# Patient Record
Sex: Female | Born: 1970 | Race: White | Hispanic: Yes | Marital: Married | Smoking: Never smoker
Health system: Southern US, Community
[De-identification: ages and names within clinical notes are randomized; demographics above are authoritative.]

## PROBLEM LIST (undated history)

## (undated) DIAGNOSIS — E785 Hyperlipidemia, unspecified: Secondary | ICD-10-CM

## (undated) DIAGNOSIS — H9190 Unspecified hearing loss, unspecified ear: Secondary | ICD-10-CM

## (undated) DIAGNOSIS — I10 Essential (primary) hypertension: Secondary | ICD-10-CM

## (undated) DIAGNOSIS — E119 Type 2 diabetes mellitus without complications: Secondary | ICD-10-CM

## (undated) DIAGNOSIS — U071 COVID-19: Secondary | ICD-10-CM

## (undated) HISTORY — DX: Type 2 diabetes mellitus without complications: E11.9

## (undated) HISTORY — DX: Essential (primary) hypertension: I10

## (undated) HISTORY — DX: Unspecified hearing loss, unspecified ear: H91.90

## (undated) HISTORY — DX: Hyperlipidemia, unspecified: E78.5

---
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
## 2000-01-28 ENCOUNTER — Encounter: Admission: RE | Admit: 2000-01-28 | Discharge: 2000-01-28 | Payer: Self-pay | Admitting: Obstetrics

## 2000-02-05 ENCOUNTER — Encounter: Admission: RE | Admit: 2000-02-05 | Discharge: 2000-02-05 | Payer: Self-pay | Admitting: Internal Medicine

## 2001-08-30 ENCOUNTER — Encounter: Payer: Self-pay | Admitting: *Deleted

## 2001-08-30 ENCOUNTER — Emergency Department (HOSPITAL_COMMUNITY): Admission: EM | Admit: 2001-08-30 | Discharge: 2001-08-30 | Payer: Self-pay

## 2001-09-14 ENCOUNTER — Encounter: Admission: RE | Admit: 2001-09-14 | Discharge: 2001-09-14 | Payer: Self-pay | Admitting: Family Medicine

## 2001-09-19 ENCOUNTER — Encounter: Admission: RE | Admit: 2001-09-19 | Discharge: 2001-09-19 | Payer: Self-pay | Admitting: Family Medicine

## 2001-10-04 ENCOUNTER — Encounter: Admission: RE | Admit: 2001-10-04 | Discharge: 2001-10-04 | Payer: Self-pay | Admitting: Family Medicine

## 2001-10-11 ENCOUNTER — Encounter: Admission: RE | Admit: 2001-10-11 | Discharge: 2001-10-11 | Payer: Self-pay | Admitting: *Deleted

## 2001-10-18 ENCOUNTER — Encounter: Admission: RE | Admit: 2001-10-18 | Discharge: 2001-10-18 | Payer: Self-pay | Admitting: *Deleted

## 2001-10-19 ENCOUNTER — Encounter: Admission: RE | Admit: 2001-10-19 | Discharge: 2002-01-17 | Payer: Self-pay | Admitting: *Deleted

## 2001-10-25 ENCOUNTER — Encounter: Admission: RE | Admit: 2001-10-25 | Discharge: 2001-10-25 | Payer: Self-pay | Admitting: *Deleted

## 2001-11-01 ENCOUNTER — Encounter: Admission: RE | Admit: 2001-11-01 | Discharge: 2001-11-01 | Payer: Self-pay | Admitting: *Deleted

## 2001-11-08 ENCOUNTER — Encounter (HOSPITAL_COMMUNITY): Admission: RE | Admit: 2001-11-08 | Discharge: 2001-12-08 | Payer: Self-pay | Admitting: *Deleted

## 2001-11-08 ENCOUNTER — Encounter: Admission: RE | Admit: 2001-11-08 | Discharge: 2001-11-08 | Payer: Self-pay | Admitting: *Deleted

## 2001-11-14 ENCOUNTER — Ambulatory Visit (HOSPITAL_COMMUNITY): Admission: RE | Admit: 2001-11-14 | Discharge: 2001-11-14 | Payer: Self-pay | Admitting: *Deleted

## 2001-11-15 ENCOUNTER — Encounter: Admission: RE | Admit: 2001-11-15 | Discharge: 2001-11-15 | Payer: Self-pay | Admitting: *Deleted

## 2001-11-22 ENCOUNTER — Encounter: Admission: RE | Admit: 2001-11-22 | Discharge: 2001-11-22 | Payer: Self-pay | Admitting: *Deleted

## 2001-12-06 ENCOUNTER — Encounter: Admission: RE | Admit: 2001-12-06 | Discharge: 2001-12-06 | Payer: Self-pay | Admitting: *Deleted

## 2001-12-13 ENCOUNTER — Encounter: Admission: RE | Admit: 2001-12-13 | Discharge: 2001-12-13 | Payer: Self-pay | Admitting: *Deleted

## 2001-12-27 ENCOUNTER — Encounter: Admission: RE | Admit: 2001-12-27 | Discharge: 2001-12-27 | Payer: Self-pay | Admitting: *Deleted

## 2002-01-03 ENCOUNTER — Encounter: Admission: RE | Admit: 2002-01-03 | Discharge: 2002-01-03 | Payer: Self-pay | Admitting: *Deleted

## 2002-01-10 ENCOUNTER — Encounter: Admission: RE | Admit: 2002-01-10 | Discharge: 2002-01-10 | Payer: Self-pay | Admitting: *Deleted

## 2002-01-24 ENCOUNTER — Inpatient Hospital Stay (HOSPITAL_COMMUNITY): Admission: AD | Admit: 2002-01-24 | Discharge: 2002-01-24 | Payer: Self-pay

## 2002-01-24 ENCOUNTER — Encounter: Admission: RE | Admit: 2002-01-24 | Discharge: 2002-01-24 | Payer: Self-pay | Admitting: *Deleted

## 2002-02-05 ENCOUNTER — Ambulatory Visit (HOSPITAL_COMMUNITY): Admission: RE | Admit: 2002-02-05 | Discharge: 2002-02-05 | Payer: Self-pay | Admitting: *Deleted

## 2002-02-07 ENCOUNTER — Encounter: Admission: RE | Admit: 2002-02-07 | Discharge: 2002-02-07 | Payer: Self-pay | Admitting: *Deleted

## 2002-02-13 ENCOUNTER — Encounter (HOSPITAL_BASED_OUTPATIENT_CLINIC_OR_DEPARTMENT_OTHER): Admission: RE | Admit: 2002-02-13 | Discharge: 2002-05-14 | Payer: Self-pay | Admitting: Internal Medicine

## 2002-02-14 ENCOUNTER — Encounter: Admission: RE | Admit: 2002-02-14 | Discharge: 2002-02-14 | Payer: Self-pay | Admitting: *Deleted

## 2002-02-21 ENCOUNTER — Encounter: Admission: RE | Admit: 2002-02-21 | Discharge: 2002-02-21 | Payer: Self-pay | Admitting: *Deleted

## 2002-02-27 ENCOUNTER — Ambulatory Visit (HOSPITAL_COMMUNITY): Admission: RE | Admit: 2002-02-27 | Discharge: 2002-02-27 | Payer: Self-pay | Admitting: *Deleted

## 2002-03-04 ENCOUNTER — Inpatient Hospital Stay (HOSPITAL_COMMUNITY): Admission: AD | Admit: 2002-03-04 | Discharge: 2002-03-04 | Payer: Self-pay | Admitting: Obstetrics and Gynecology

## 2002-03-07 ENCOUNTER — Encounter (HOSPITAL_COMMUNITY): Admission: RE | Admit: 2002-03-07 | Discharge: 2002-04-06 | Payer: Self-pay | Admitting: *Deleted

## 2002-03-07 ENCOUNTER — Encounter: Admission: RE | Admit: 2002-03-07 | Discharge: 2002-03-07 | Payer: Self-pay | Admitting: *Deleted

## 2002-03-14 ENCOUNTER — Encounter: Admission: RE | Admit: 2002-03-14 | Discharge: 2002-03-14 | Payer: Self-pay | Admitting: *Deleted

## 2002-03-21 ENCOUNTER — Encounter: Admission: RE | Admit: 2002-03-21 | Discharge: 2002-03-21 | Payer: Self-pay | Admitting: *Deleted

## 2002-03-28 ENCOUNTER — Encounter: Admission: RE | Admit: 2002-03-28 | Discharge: 2002-03-28 | Payer: Self-pay | Admitting: *Deleted

## 2002-04-04 ENCOUNTER — Encounter: Admission: RE | Admit: 2002-04-04 | Discharge: 2002-04-04 | Payer: Self-pay | Admitting: *Deleted

## 2002-04-09 ENCOUNTER — Encounter (HOSPITAL_COMMUNITY): Admission: RE | Admit: 2002-04-09 | Discharge: 2002-04-16 | Payer: Self-pay | Admitting: Obstetrics and Gynecology

## 2002-04-11 ENCOUNTER — Encounter: Admission: RE | Admit: 2002-04-11 | Discharge: 2002-04-11 | Payer: Self-pay | Admitting: *Deleted

## 2002-04-12 ENCOUNTER — Encounter: Admission: RE | Admit: 2002-04-12 | Discharge: 2002-04-12 | Payer: Self-pay | Admitting: *Deleted

## 2002-04-13 ENCOUNTER — Inpatient Hospital Stay (HOSPITAL_COMMUNITY): Admission: AD | Admit: 2002-04-13 | Discharge: 2002-04-13 | Payer: Self-pay | Admitting: Family Medicine

## 2002-04-17 ENCOUNTER — Inpatient Hospital Stay (HOSPITAL_COMMUNITY): Admission: AD | Admit: 2002-04-17 | Discharge: 2002-04-20 | Payer: Self-pay | Admitting: *Deleted

## 2002-04-30 ENCOUNTER — Encounter: Admission: RE | Admit: 2002-04-30 | Discharge: 2002-04-30 | Payer: Self-pay | Admitting: Family Medicine

## 2002-07-13 ENCOUNTER — Encounter: Admission: RE | Admit: 2002-07-13 | Discharge: 2002-07-13 | Payer: Self-pay | Admitting: Family Medicine

## 2002-08-13 ENCOUNTER — Encounter: Admission: RE | Admit: 2002-08-13 | Discharge: 2002-08-13 | Payer: Self-pay | Admitting: Family Medicine

## 2002-08-13 ENCOUNTER — Encounter: Admission: RE | Admit: 2002-08-13 | Discharge: 2002-08-13 | Payer: Self-pay | Admitting: Sports Medicine

## 2002-08-20 ENCOUNTER — Encounter: Admission: RE | Admit: 2002-08-20 | Discharge: 2002-08-20 | Payer: Self-pay | Admitting: Family Medicine

## 2003-07-16 ENCOUNTER — Encounter (INDEPENDENT_AMBULATORY_CARE_PROVIDER_SITE_OTHER): Payer: Self-pay | Admitting: *Deleted

## 2003-07-16 LAB — CONVERTED CEMR LAB

## 2003-08-01 ENCOUNTER — Encounter: Admission: RE | Admit: 2003-08-01 | Discharge: 2003-08-01 | Payer: Self-pay | Admitting: Sports Medicine

## 2004-02-10 ENCOUNTER — Encounter: Admission: RE | Admit: 2004-02-10 | Discharge: 2004-02-10 | Payer: Self-pay | Admitting: Family Medicine

## 2004-02-12 ENCOUNTER — Encounter: Admission: RE | Admit: 2004-02-12 | Discharge: 2004-02-12 | Payer: Self-pay | Admitting: Family Medicine

## 2004-04-08 ENCOUNTER — Ambulatory Visit: Payer: Self-pay | Admitting: Sports Medicine

## 2006-05-16 ENCOUNTER — Ambulatory Visit: Payer: Self-pay | Admitting: Family Medicine

## 2006-06-14 DIAGNOSIS — I10 Essential (primary) hypertension: Secondary | ICD-10-CM

## 2006-06-14 HISTORY — DX: Essential (primary) hypertension: I10

## 2006-08-11 DIAGNOSIS — H60399 Other infective otitis externa, unspecified ear: Secondary | ICD-10-CM | POA: Insufficient documentation

## 2006-08-11 DIAGNOSIS — E1165 Type 2 diabetes mellitus with hyperglycemia: Secondary | ICD-10-CM | POA: Insufficient documentation

## 2006-08-12 ENCOUNTER — Encounter (INDEPENDENT_AMBULATORY_CARE_PROVIDER_SITE_OTHER): Payer: Self-pay | Admitting: *Deleted

## 2007-02-20 ENCOUNTER — Ambulatory Visit: Payer: Self-pay | Admitting: Internal Medicine

## 2007-02-21 ENCOUNTER — Ambulatory Visit: Payer: Self-pay | Admitting: *Deleted

## 2007-03-01 ENCOUNTER — Encounter (INDEPENDENT_AMBULATORY_CARE_PROVIDER_SITE_OTHER): Payer: Self-pay | Admitting: *Deleted

## 2007-03-07 ENCOUNTER — Ambulatory Visit: Payer: Self-pay | Admitting: Family Medicine

## 2007-03-21 ENCOUNTER — Ambulatory Visit: Payer: Self-pay | Admitting: Family Medicine

## 2007-04-04 ENCOUNTER — Ambulatory Visit: Payer: Self-pay | Admitting: Family Medicine

## 2007-05-02 ENCOUNTER — Ambulatory Visit: Payer: Self-pay | Admitting: Family Medicine

## 2007-05-16 ENCOUNTER — Ambulatory Visit: Payer: Self-pay | Admitting: Family Medicine

## 2007-06-15 DIAGNOSIS — E119 Type 2 diabetes mellitus without complications: Secondary | ICD-10-CM

## 2007-06-15 HISTORY — DX: Type 2 diabetes mellitus without complications: E11.9

## 2007-06-16 ENCOUNTER — Ambulatory Visit: Payer: Self-pay | Admitting: Family Medicine

## 2008-05-29 ENCOUNTER — Encounter (INDEPENDENT_AMBULATORY_CARE_PROVIDER_SITE_OTHER): Payer: Self-pay | Admitting: Internal Medicine

## 2008-05-29 ENCOUNTER — Ambulatory Visit: Payer: Self-pay | Admitting: Internal Medicine

## 2008-05-29 LAB — CONVERTED CEMR LAB
ALT: 17 units/L (ref 0–35)
AST: 13 units/L (ref 0–37)
Albumin: 4.3 g/dL (ref 3.5–5.2)
Alkaline Phosphatase: 84 units/L (ref 39–117)
BUN: 9 mg/dL (ref 6–23)
CO2: 23 meq/L (ref 19–32)
Calcium: 8.9 mg/dL (ref 8.4–10.5)
Chloride: 103 meq/L (ref 96–112)
Creatinine, Ser: 0.71 mg/dL (ref 0.40–1.20)
Glucose, Bld: 151 mg/dL — ABNORMAL HIGH (ref 70–99)
Potassium: 4.3 meq/L (ref 3.5–5.3)
Sodium: 137 meq/L (ref 135–145)
Total Bilirubin: 0.6 mg/dL (ref 0.3–1.2)
Total Protein: 7.4 g/dL (ref 6.0–8.3)

## 2008-06-25 ENCOUNTER — Ambulatory Visit: Payer: Self-pay | Admitting: Internal Medicine

## 2008-07-11 ENCOUNTER — Ambulatory Visit: Payer: Self-pay | Admitting: Internal Medicine

## 2008-09-11 ENCOUNTER — Encounter (INDEPENDENT_AMBULATORY_CARE_PROVIDER_SITE_OTHER): Payer: Self-pay | Admitting: Internal Medicine

## 2008-09-11 ENCOUNTER — Ambulatory Visit: Payer: Self-pay | Admitting: Internal Medicine

## 2008-09-11 LAB — CONVERTED CEMR LAB
ALT: 20 units/L (ref 0–35)
AST: 17 units/L (ref 0–37)
Albumin: 4 g/dL (ref 3.5–5.2)
CO2: 23 meq/L (ref 19–32)
Calcium: 8.8 mg/dL (ref 8.4–10.5)
Chloride: 105 meq/L (ref 96–112)
Cholesterol: 122 mg/dL (ref 0–200)
Creatinine, Ser: 0.63 mg/dL (ref 0.40–1.20)
Potassium: 3.9 meq/L (ref 3.5–5.3)
Sodium: 137 meq/L (ref 135–145)
Total Protein: 7 g/dL (ref 6.0–8.3)

## 2008-09-25 ENCOUNTER — Ambulatory Visit: Payer: Self-pay | Admitting: Internal Medicine

## 2008-10-25 ENCOUNTER — Ambulatory Visit: Payer: Self-pay | Admitting: Internal Medicine

## 2008-12-25 ENCOUNTER — Ambulatory Visit: Payer: Self-pay | Admitting: Internal Medicine

## 2009-01-08 ENCOUNTER — Ambulatory Visit: Payer: Self-pay | Admitting: Internal Medicine

## 2009-04-02 ENCOUNTER — Ambulatory Visit: Payer: Self-pay | Admitting: Internal Medicine

## 2009-04-10 ENCOUNTER — Ambulatory Visit: Payer: Self-pay | Admitting: Internal Medicine

## 2009-04-10 ENCOUNTER — Encounter (INDEPENDENT_AMBULATORY_CARE_PROVIDER_SITE_OTHER): Payer: Self-pay | Admitting: Internal Medicine

## 2009-04-10 LAB — CONVERTED CEMR LAB
Cholesterol: 122 mg/dL (ref 0–200)
HDL: 37 mg/dL — ABNORMAL LOW (ref >39)
LDL Cholesterol: 70 mg/dL (ref 0–99)
Total CHOL/HDL Ratio: 3.3
Triglycerides: 75 mg/dL (ref <150)
VLDL: 15 mg/dL (ref 0–40)

## 2009-07-07 ENCOUNTER — Encounter (INDEPENDENT_AMBULATORY_CARE_PROVIDER_SITE_OTHER): Payer: Self-pay | Admitting: Family Medicine

## 2009-07-07 LAB — CONVERTED CEMR LAB
BUN: 11 mg/dL (ref 6–23)
CO2: 26 meq/L (ref 19–32)
Calcium: 8.8 mg/dL (ref 8.4–10.5)
Glucose, Bld: 116 mg/dL — ABNORMAL HIGH (ref 70–99)

## 2009-11-17 ENCOUNTER — Ambulatory Visit: Payer: Self-pay | Admitting: Internal Medicine

## 2009-12-16 ENCOUNTER — Ambulatory Visit: Payer: Self-pay | Admitting: Internal Medicine

## 2010-08-27 ENCOUNTER — Encounter (INDEPENDENT_AMBULATORY_CARE_PROVIDER_SITE_OTHER): Payer: Self-pay | Admitting: *Deleted

## 2010-08-27 LAB — CONVERTED CEMR LAB
ALT: 16 units/L (ref 0–35)
Alkaline Phosphatase: 80 units/L (ref 39–117)
CO2: 23 meq/L (ref 19–32)
Creatinine, Ser: 0.66 mg/dL (ref 0.40–1.20)
Sodium: 136 meq/L (ref 135–145)
Total Bilirubin: 0.5 mg/dL (ref 0.3–1.2)
Total Protein: 7.5 g/dL (ref 6.0–8.3)

## 2010-10-30 NOTE — Consult Note (Signed)
NAME:  Isabella Joyce, Isabella Joyce                 ACCOUNT NO.:  [id number]   MEDICAL RECORD NO.:  [id number]                   PATIENT TYPE:  REC   LOCATION:  FOOT                                 FACILITY:  [facility]   PHYSICIAN:  Jonelle Sports. Sevier, MD                DATE OF BIRTH:  [birth year]   DATE OF CONSULTATION:  02/15/2002  DATE OF DISCHARGE:                                   CONSULTATION   HISTORY:  This 39 year old [place] female who is in her mid trimester  pregnancy and with gestational diabetes was referred for pruritic rash on  the toes and plantar aspect of the left foot, particularly the forefoot.  The patient speaks very little [place] and our information is obtained from  the interpreter but apparently she has never had serious problems with her  feet, although her inspection does reveal that she has extensive  onychomycosis, particularly of that left foot.  She apparently had some  cream that she had obtained some time past in [place] which she has used with  some improvement.   PAST MEDICAL HISTORY:  Not obtainable in detail but apparently her general  health has been excellent.  She has no serious chronic conditions and aside  from her pregnancy with gestational diabetes, does well.   CURRENT MEDICATIONS:  She is on insulin presently for management of that  condition, but no other medications.   ALLERGIES:  She has no known medicinal allergies.   PHYSICAL EXAMINATION:  EXTREMITIES:  Examination today is limited to the  distal lower extremities.  The feet are quite broad with large digits, but  with no gross deformity.  Skin temperatures are normal and equal  bilaterally.  All pulses are palpable and adequate.  Monofilament testing  shows that she has protective sensation throughout both feet.  She has no  open lesions on her feet, but does have some dry cracking and slightly  inflamed skin involving the dorsal aspects of the toes, interdigital spaces,  and the plantar  forefoot and instep area on the left.  The right foot  appears to have no such involvement.  There are minor calluses of both  heels.   IMPRESSION:  Dermatocytosis left forefoot (unfortunately we are unable to  confirm this by scraping because of our lack of microscope here at this  time).   DISPOSITION:  1. The patient is given a prescription for Lotrisone cream in the generic     and advised to use that at a minimum of once daily, but twice daily if     needed because of the itching.  This prescription is provided to the     patient and the     nurse has contacted an interpreter who has explained these things to the     patient over the phone before she leaves our clinic.  2. Follow-up here will be on a p.r.n. basis.  Jonelle Sports. Sevier, MD    RES/MEDQ  D:  02/15/2002  T:  02/15/2002  Job:  [id number]   cc:   Conni Elliot, M.D.  [address]  [place]  [place] [address]  Fax: [phone]

## 2013-01-18 ENCOUNTER — Other Ambulatory Visit (HOSPITAL_COMMUNITY): Payer: Self-pay | Admitting: Internal Medicine

## 2013-01-18 DIAGNOSIS — Z1231 Encounter for screening mammogram for malignant neoplasm of breast: Secondary | ICD-10-CM

## 2013-01-19 ENCOUNTER — Ambulatory Visit (HOSPITAL_COMMUNITY)
Admission: RE | Admit: 2013-01-19 | Discharge: 2013-01-19 | Disposition: A | Discharge status: Home / Self Care | Payer: Self-pay | Source: Ambulatory Visit | Attending: Internal Medicine | Admitting: Internal Medicine

## 2013-01-19 DIAGNOSIS — Z1231 Encounter for screening mammogram for malignant neoplasm of breast: Secondary | ICD-10-CM | POA: Insufficient documentation

## 2013-02-26 ENCOUNTER — Encounter: Payer: Self-pay | Admitting: *Deleted

## 2013-03-01 ENCOUNTER — Ambulatory Visit (INDEPENDENT_AMBULATORY_CARE_PROVIDER_SITE_OTHER): Payer: Self-pay | Admitting: Family Medicine

## 2013-03-01 ENCOUNTER — Encounter: Payer: Self-pay | Admitting: Family Medicine

## 2013-03-01 VITALS — BP 138/90 | HR 72 | Temp 97.4°F | Ht 62.0 in | Wt 191.3 lb

## 2013-03-01 DIAGNOSIS — Z01419 Encounter for gynecological examination (general) (routine) without abnormal findings: Secondary | ICD-10-CM

## 2013-03-01 DIAGNOSIS — Z124 Encounter for screening for malignant neoplasm of cervix: Secondary | ICD-10-CM

## 2013-03-01 NOTE — Patient Instructions (Addendum)
  Place annual gynecologic exam patient instructions here. Prueba de Papanicolaou  (Pap Test)  La prueba de Papanicolaou estudia las clulas en la superficie del cuello del tero. Su mdico buscar cambios celulares anormales, una infeccin o clulas cancerosas. Se llama displasia cuando las clulas no son normales. La displasia puede convertirse en cncer. Son importantes las pruebas regulares de Papanicolaou para detener el desarrollo del cncer. ANTES DEL PROCEDIMIENTO   Pregntele a su mdico cundo programar su prueba de Papanicolaou. Puede ser importante que programe la prueba lejos del momento del perodo.  No use duchas vaginales ni tenga sexo Marine scientist) durante las 24 horas anteriores al procedimiento.  No use cremas vaginales ni tampones durante las 24 horas antes de la prueba.  Debe hacer pis orinar justo antes de la prueba. PROCEDIMIENTO   Deber Arsenio Loader en una camilla con los pies en los estribos.  Insertarn en la vagina un instrumento tibio metlico o de plstico (espculo) para Restaurant manager, fast food.  El mdico usar un pequeo cepillo de plstico o esptula de [place] para tomar clulas del cuello uterino.  Las clulas se colocan en un recipiente de laboratorio.  Luego se examinan en el microscopio para determinar si son normales o no. DESPUS DEL PROCEDIMIENTO  Retire los [employer] prueba. Si son anormales, puede ser que Financial risk analyst ms [employer].  Document Released: 09/15/2010 Document Revised: 08/23/2011 [facility] Patient Information 2014 [place], [place].

## 2013-03-01 NOTE — Progress Notes (Signed)
  Subjective:     Isabella Joyce is a 42 y.o. female and is here for a comprehensive physical exam. The patient reports no problems.  History   Social History  . Marital Status: Married    Spouse Name: N/A    Number of Children: N/A  . Years of Education: N/A   Occupational History  . Not on file.   Social History Main Topics  . Smoking status: Never Smoker   . Smokeless tobacco: Never Used  . Alcohol Use: No  . Drug Use: No  . Sexual Activity: Yes    Birth Control/ Protection: IUD   Other Topics Concern  . Not on file   Social History Narrative  . No narrative on file   Health Maintenance  Topic Date Due  . Tetanus/tdap  03/04/1990  . Pap Smear  07/15/2006  . Influenza Vaccine  01/12/2013    The following portions of the patient's history were reviewed and updated as appropriate: allergies, current medications, past family history, past medical history, past social history, past surgical history and problem list.  Review of Systems Pertinent items are noted in HPI.   Objective:    General appearance: alert, cooperative, appears stated age and no distress Head: Normocephalic, without obvious abnormality, atraumatic Neck: no adenopathy, no carotid bruit, no JVD, supple, symmetrical, trachea midline and thyroid not enlarged, symmetric, no tenderness/mass/nodules Lungs: clear to auscultation bilaterally and normal percussion bilaterally Heart: regular rate and rhythm, S1, S2 normal, no murmur, click, rub or gallop, normal apical impulse and prominent apical impulse Abdomen: soft, non-tender; bowel sounds normal; no masses,  no organomegaly Extremities: extremities normal, atraumatic, no cyanosis or edema    Normal labia, moisture, NO strings visualized from IUD. Pap smear perfomred. Assessment:    Healthy female exam. Here for routine pap smear      Plan:  Pap smear today. Pap With HPV - if negative will screen q5years F/u with PCM for additional routine  care and screening. Non visualized strings, pt currently amenorric for 1year. Likely in place and not a concern for pt at this time. May require additional eval at time of removal.  Tawana Scale, MD [facility] Fellow

## 2013-03-06 ENCOUNTER — Telehealth: Payer: Self-pay | Admitting: *Deleted

## 2013-03-06 ENCOUNTER — Encounter: Payer: Self-pay | Admitting: Family Medicine

## 2013-03-06 DIAGNOSIS — Z124 Encounter for screening for malignant neoplasm of cervix: Secondary | ICD-10-CM | POA: Insufficient documentation

## 2013-03-06 NOTE — Telephone Encounter (Signed)
Message copied by Mannie Stabile on Tue Mar 06, 2013 11:48 AM ------      Message from: Minta Balsam      Created: Tue Mar 06, 2013  9:54 AM       Please call and inform normal pap smear. No HPV. Due for pap in 2019            THanks MRO ------

## 2013-03-06 NOTE — Telephone Encounter (Signed)
Called patient informed her of normal pap and followup recommendations.

## 2014-03-04 ENCOUNTER — Encounter: Payer: Self-pay | Admitting: Family Medicine

## 2014-03-04 ENCOUNTER — Ambulatory Visit: Payer: Self-pay | Attending: Family Medicine | Admitting: Family Medicine

## 2014-03-04 VITALS — BP 123/83 | HR 70 | Temp 97.9°F | Resp 18 | Ht 65.0 in | Wt 181.0 lb

## 2014-03-04 DIAGNOSIS — R7309 Other abnormal glucose: Secondary | ICD-10-CM

## 2014-03-04 DIAGNOSIS — Z23 Encounter for immunization: Secondary | ICD-10-CM

## 2014-03-04 DIAGNOSIS — B351 Tinea unguium: Secondary | ICD-10-CM

## 2014-03-04 DIAGNOSIS — I10 Essential (primary) hypertension: Secondary | ICD-10-CM

## 2014-03-04 DIAGNOSIS — E119 Type 2 diabetes mellitus without complications: Secondary | ICD-10-CM

## 2014-03-04 DIAGNOSIS — J309 Allergic rhinitis, unspecified: Secondary | ICD-10-CM

## 2014-03-04 DIAGNOSIS — R739 Hyperglycemia, unspecified: Secondary | ICD-10-CM

## 2014-03-04 LAB — COMPLETE METABOLIC PANEL WITH GFR
ALBUMIN: 4.2 g/dL (ref 3.5–5.2)
ALK PHOS: 75 U/L (ref 39–117)
ALT: 11 U/L (ref 0–35)
AST: 11 U/L (ref 0–37)
BUN: 11 mg/dL (ref 6–23)
CHLORIDE: 104 meq/L (ref 96–112)
CO2: 26 mEq/L (ref 19–32)
Calcium: 9.4 mg/dL (ref 8.4–10.5)
Creat: 0.68 mg/dL (ref 0.50–1.10)
GFR, Est African American: >89 mL/min
GFR, Est Non African American: >89 mL/min
Glucose, Bld: 110 mg/dL — ABNORMAL HIGH (ref 70–99)
POTASSIUM: 4.7 meq/L (ref 3.5–5.3)
SODIUM: 138 meq/L (ref 135–145)
TOTAL PROTEIN: 7.2 g/dL (ref 6.0–8.3)
Total Bilirubin: 0.8 mg/dL (ref 0.2–1.2)

## 2014-03-04 LAB — POCT GLYCOSYLATED HEMOGLOBIN (HGB A1C): Hemoglobin A1C: 6.5

## 2014-03-04 LAB — LIPID PANEL
CHOL/HDL RATIO: 3.3 ratio
Cholesterol: 142 mg/dL (ref 0–200)
HDL: 43 mg/dL (ref >39)
LDL CALC: 82 mg/dL (ref 0–99)
Triglycerides: 85 mg/dL (ref <150)
VLDL: 17 mg/dL (ref 0–40)

## 2014-03-04 LAB — POCT CBG (FASTING - GLUCOSE)-MANUAL ENTRY: Glucose Fasting, POC: 124 mg/dL — AB (ref 70–99)

## 2014-03-04 MED ORDER — TRIAMCINOLONE ACETONIDE 55 MCG/ACT NA AERO: 2.0000 | INHALATION_SPRAY | Freq: Every day | NASAL | Status: DC

## 2014-03-04 MED ORDER — TERBINAFINE HCL 250 MG PO TABS: 250.0000 mg | ORAL_TABLET | Freq: Every day | ORAL | Status: DC

## 2014-03-04 MED ORDER — FLUTICASONE PROPIONATE 50 MCG/ACT NA SUSP: 2.0000 | Freq: Every day | NASAL | Status: DC

## 2014-03-04 MED ORDER — LISINOPRIL 20 MG PO TABS: 20.0000 mg | ORAL_TABLET | Freq: Every day | ORAL | Status: DC

## 2014-03-04 MED ORDER — DILTIAZEM HCL ER 180 MG PO CP24: 180.0000 mg | ORAL_CAPSULE | Freq: Every day | ORAL | Status: DC

## 2014-03-04 MED ORDER — METFORMIN HCL 1000 MG PO TABS: 500.0000 mg | ORAL_TABLET | Freq: Two times a day (BID) | ORAL | Status: DC

## 2014-03-04 NOTE — Patient Instructions (Addendum)
Isabella Joyce,  Gracias por venido hoy.  It was a pleasure meeting you. I look forward to being your primary doctor.  1. Regarding diabetes: very well controlled. Continue diet, continue walking, continue metformin 500 mg twice daily.  2. Regarding hypertension: also very well controlled. Continue current medication regimen.   3. Regarding swollen nose and congestion: start nasacort   4. Regarding toenail fungus: lamisil for 12 weeks course, checking liver function test first.   F/u in 6 months, sooner if needed  Dr. Armen Pickup

## 2014-03-04 NOTE — Assessment & Plan Note (Signed)
Regarding hypertension: also very well controlled. Continue current medication regimen.

## 2014-03-04 NOTE — Progress Notes (Signed)
   Subjective:    Patient ID: Isabella Joyce, female    DOB: [birth year], 43 y.o.   MRN: [id number] CC: establish care, hypertension, diabetes  HPI A 43 year old female presents to establish care discussed the following:  CHRONIC HYPERTENSION  Disease Monitoring  Blood pressure range: does not check   Chest pain: no   Dyspnea: yes, sometimes when it is hot    Claudication: no   Medication compliance: yes  Medication Side Effects  Lightheadedness: no   Urinary frequency: no   Edema: no   Impotence: no    2. CHRONIC DIABETES  Disease Monitoring  Blood Sugar Ranges: normal   Polyuria: no   Visual problems: no   Medication Compliance: yes  Medication Side Effects  Hypoglycemia: no   Preventitive Health Care  Eye Exam: due   Foot Exam:  Done today   Diet pattern: eats 3 meals, low carb diet   Exercise: yes    Soc Hx: non smoker  Review of Systems As per HPI  Occasional nasal congestion, ringing in ears during the winter months.     Objective:   Physical Exam BP 123/83  Pulse 70  Temp(Src) 97.9 F (36.6 C) (Oral)  Resp 18  Ht  (1.651 m)  Wt 181 lb (82.101 kg)  BMI 30.12 kg/m2 General appearance: alert, cooperative and no distress Eyes: conjunctivae/corneas clear. PERRL, EOM's intact.  Ears: normal TM's and external ear canals both ears Nose: no discharge, turbinates pink, swollen Throat: lips, mucosa, and tongue normal; teeth and gums normal Neck: no adenopathy, supple, symmetrical, trachea midline and thyroid not enlarged, symmetric, no tenderness/mass/nodules Lungs: clear to auscultation bilaterally Heart: regular rate and rhythm, S1, S2 normal, no murmur, click, rub or gallop Extremities: extremities normal, atraumatic, no cyanosis or edema, toenails thick and brittle        Assessment & Plan:

## 2014-03-04 NOTE — Assessment & Plan Note (Addendum)
Regarding toenail fungus: lamisil for 12 weeks course, checking liver function test first.  Normal LFTs, fine to stat lamisil.

## 2014-03-04 NOTE — Assessment & Plan Note (Addendum)
Regarding diabetes: very well controlled. Continue diet, continue walking, continue metformin 500 mg twice daily. Good lipid panel, but given diabetes patient should be on statin therapy with goal LDL < 70. Sent in lipitor 10 mg daily to onsite pharmacy.

## 2014-03-04 NOTE — Assessment & Plan Note (Signed)
Regarding swollen nose and congestion: start flonase.

## 2014-03-04 NOTE — Progress Notes (Signed)
Establish Care Pt stated need Rx refills , Hx DM and HTN

## 2014-03-06 ENCOUNTER — Telehealth: Payer: Self-pay | Admitting: *Deleted

## 2014-03-06 MED ORDER — ATORVASTATIN CALCIUM 10 MG PO TABS: 20.0000 mg | ORAL_TABLET | Freq: Every day | ORAL | Status: DC

## 2014-03-06 NOTE — Telephone Encounter (Signed)
Message copied by Dyann Kief on Wed Mar 06, 2014  5:54 PM ------      Message from: Dessa Phi      Created: Wed Mar 06, 2014  2:44 PM       actually lipitor 10 daily, since patient is not too far from LDL goal ------

## 2014-03-06 NOTE — Addendum Note (Signed)
Addended by: Dessa Phi on: 03/06/2014 02:43 PM   Modules accepted: Orders

## 2014-03-06 NOTE — Telephone Encounter (Signed)
Left message to return call 

## 2014-03-07 ENCOUNTER — Telehealth: Payer: Self-pay | Admitting: *Deleted

## 2014-03-07 NOTE — Telephone Encounter (Signed)
Message copied by Dyann Kief on Thu Mar 07, 2014  1:00 PM ------      Message from: Dessa Phi      Created: Wed Mar 06, 2014  2:44 PM       actually lipitor 10 daily, since patient is not too far from LDL goal ------

## 2014-03-07 NOTE — Telephone Encounter (Signed)
Left message to return call, Notified Rx in our pharmacy

## 2014-04-15 ENCOUNTER — Encounter: Payer: Self-pay | Admitting: Family Medicine

## 2014-05-13 ENCOUNTER — Other Ambulatory Visit: Payer: Self-pay | Admitting: Family Medicine

## 2014-05-17 ENCOUNTER — Other Ambulatory Visit: Payer: Self-pay | Admitting: Family Medicine

## 2014-05-20 ENCOUNTER — Other Ambulatory Visit: Payer: Self-pay | Admitting: Family Medicine

## 2014-05-20 DIAGNOSIS — B351 Tinea unguium: Secondary | ICD-10-CM

## 2014-05-20 DIAGNOSIS — E119 Type 2 diabetes mellitus without complications: Secondary | ICD-10-CM

## 2014-05-20 MED ORDER — TERBINAFINE HCL 250 MG PO TABS: 250.0000 mg | ORAL_TABLET | Freq: Every day | ORAL | Status: DC

## 2014-05-20 MED ORDER — METFORMIN HCL 500 MG PO TABS: 500.0000 mg | ORAL_TABLET | Freq: Two times a day (BID) | ORAL | Status: DC

## 2014-08-09 ENCOUNTER — Ambulatory Visit: Payer: Self-pay | Attending: Family Medicine | Admitting: Family Medicine

## 2014-08-09 ENCOUNTER — Encounter: Payer: Self-pay | Admitting: Family Medicine

## 2014-08-09 VITALS — BP 128/85 | HR 73 | Temp 98.6°F | Resp 16 | Ht 65.0 in | Wt 182.0 lb

## 2014-08-09 DIAGNOSIS — B351 Tinea unguium: Secondary | ICD-10-CM | POA: Insufficient documentation

## 2014-08-09 DIAGNOSIS — E119 Type 2 diabetes mellitus without complications: Secondary | ICD-10-CM | POA: Insufficient documentation

## 2014-08-09 DIAGNOSIS — Z23 Encounter for immunization: Secondary | ICD-10-CM

## 2014-08-09 DIAGNOSIS — H9313 Tinnitus, bilateral: Secondary | ICD-10-CM | POA: Insufficient documentation

## 2014-08-09 DIAGNOSIS — I10 Essential (primary) hypertension: Secondary | ICD-10-CM

## 2014-08-09 DIAGNOSIS — E1165 Type 2 diabetes mellitus with hyperglycemia: Secondary | ICD-10-CM

## 2014-08-09 DIAGNOSIS — H9319 Tinnitus, unspecified ear: Secondary | ICD-10-CM | POA: Insufficient documentation

## 2014-08-09 DIAGNOSIS — Z114 Encounter for screening for human immunodeficiency virus [HIV]: Secondary | ICD-10-CM | POA: Insufficient documentation

## 2014-08-09 DIAGNOSIS — H9203 Otalgia, bilateral: Secondary | ICD-10-CM | POA: Insufficient documentation

## 2014-08-09 DIAGNOSIS — J309 Allergic rhinitis, unspecified: Secondary | ICD-10-CM

## 2014-08-09 LAB — COMPLETE METABOLIC PANEL WITH GFR
ALK PHOS: 64 U/L (ref 39–117)
ALT: 12 U/L (ref 0–35)
AST: 11 U/L (ref 0–37)
Albumin: 4 g/dL (ref 3.5–5.2)
BUN: 9 mg/dL (ref 6–23)
CALCIUM: 9 mg/dL (ref 8.4–10.5)
CHLORIDE: 103 meq/L (ref 96–112)
CO2: 24 mEq/L (ref 19–32)
CREATININE: 0.64 mg/dL (ref 0.50–1.10)
GFR, Est African American: >89 mL/min
GLUCOSE: 128 mg/dL — AB (ref 70–99)
POTASSIUM: 4.7 meq/L (ref 3.5–5.3)
SODIUM: 137 meq/L (ref 135–145)
TOTAL PROTEIN: 6.7 g/dL (ref 6.0–8.3)
Total Bilirubin: 0.9 mg/dL (ref 0.2–1.2)

## 2014-08-09 LAB — GLUCOSE, POCT (MANUAL RESULT ENTRY): POC Glucose: 127 mg/dl — AB (ref 70–99)

## 2014-08-09 LAB — POCT GLYCOSYLATED HEMOGLOBIN (HGB A1C): Hemoglobin A1C: 6.9

## 2014-08-09 MED ORDER — METFORMIN HCL 500 MG PO TABS: 500.0000 mg | ORAL_TABLET | Freq: Two times a day (BID) | ORAL | Status: DC

## 2014-08-09 MED ORDER — TRUEPLUS LANCETS 28G MISC
1.0000 | Freq: Two times a day (BID) | Status: DC
Start: 2014-08-09 — End: 2017-08-02

## 2014-08-09 MED ORDER — GLUCOSE BLOOD VI STRP: 1.0000 | ORAL_STRIP | Freq: Two times a day (BID) | Status: DC

## 2014-08-09 MED ORDER — FLUTICASONE PROPIONATE 50 MCG/ACT NA SUSP: 2.0000 | Freq: Every day | NASAL | Status: DC

## 2014-08-09 MED ORDER — TRUERESULT BLOOD GLUCOSE W/DEVICE KIT: 1.0000 | PACK | Freq: Three times a day (TID) | Status: DC

## 2014-08-09 MED ORDER — ATORVASTATIN CALCIUM 20 MG PO TABS: 20.0000 mg | ORAL_TABLET | Freq: Every day | ORAL | Status: DC

## 2014-08-09 MED ORDER — TERBINAFINE HCL 250 MG PO TABS: 250.0000 mg | ORAL_TABLET | Freq: Every day | ORAL | Status: DC

## 2014-08-09 MED ORDER — DILTIAZEM HCL ER 180 MG PO CP24: 180.0000 mg | ORAL_CAPSULE | Freq: Every day | ORAL | Status: DC

## 2014-08-09 MED ORDER — LISINOPRIL 20 MG PO TABS: 20.0000 mg | ORAL_TABLET | Freq: Every day | ORAL | Status: DC

## 2014-08-09 NOTE — Progress Notes (Signed)
   Subjective:    Patient ID: Isabella Joyce, female    DOB: 16-Nov-1970, 44 y.o.   MRN: [id number] CC: f/u DM HPI 44 yo F f/u DM:  1. CHRONIC DIABETES  Disease Monitoring  Blood Sugar Ranges: not checking for past 3 weeks   Polyuria: no   Visual problems: no   Medication Compliance: yes  Medication Side Effects  Hypoglycemia: no   Preventitive Health Care  Eye Exam: due   Foot Exam: not done   Diet pattern: not done   2. Pain and ringing in ears: x one month. With pressure and hearing loss. Similar symptoms 15 years ago. No fever, chills, HA, weakness, numbness. No ASA or NSAID. Taking ACEi and CCB.    Soc Hx: chonic non smoker  Review of Systems As per HPI  Improved toenail fungus     Objective:   Physical Exam BP 128/85 mmHg  Pulse 73  Temp(Src) 98.6 F (37 C)  Resp 16  Ht 5\' 5"  (1.651 m)  Wt 182 lb (82.555 kg)  BMI 30.29 kg/m2  SpO2 100%  Wt Readings from Last 3 Encounters:  08/09/14 182 lb (82.555 kg)  03/04/14 181 lb (82.101 kg)  03/01/13 191 lb 4.8 oz (86.773 kg)  General appearance: alert, cooperative and no distress Ears: normal TM's and external ear canals both ears Nose: turbinates pink, swollen Lungs: clear to auscultation bilaterally Heart: regular rate and rhythm, S1, S2 normal, no murmur, click, rub or gallop Extremities: extremities normal, atraumatic, no cyanosis or edema  Lab Results  Component Value Date   HGBA1C 6.90 08/09/2014   CBG 127     Assessment & Plan:

## 2014-08-09 NOTE — Assessment & Plan Note (Signed)
Ringing in ears: consider Acei or CCB vs allergic rhinitis causing eustachian tube dysfunction  Audiology referral Use flonase for swollen nasal turbinates

## 2014-08-09 NOTE — Progress Notes (Signed)
Patient here to f/u  Patient reports her toenail fungus is better Patient reports 7/10 pain and ringing in both ears Patient needs refills on all medications Patient will take Tdap andPNA shot today

## 2014-08-09 NOTE — Progress Notes (Signed)
Interpreter Celene SkeenBelen Watkins provided interpreting services

## 2014-08-09 NOTE — Assessment & Plan Note (Signed)
Toenail fungus: improved, another 12 week course of lamisil.

## 2014-08-09 NOTE — Assessment & Plan Note (Signed)
Screening HIV  

## 2014-08-09 NOTE — Assessment & Plan Note (Signed)
A; DM2 well controlled Med: compliant P: Continue current regimen Referral for yearly eye exam

## 2014-08-09 NOTE — Patient Instructions (Addendum)
[name],  1. Diabetes: still very well controlled Diabetes  Check blood sugar; fasting and after meals up to twice daily  Goal fasting 100  Goal after eating < 160 Beware of hypoglycemia (low blood sugar) which is blood sugar < 70 with or without symptoms   2. Ringing in ears:  Audiology referral Use flonase for swollen nasal turbinates  3. Screening HIV  4. Toenail fungus: improved, another 12 week course of lamisil.  F/u in 2 months for ringing in ears  Dr. Armen PickupFunches

## 2014-08-10 LAB — HIV ANTIBODY (ROUTINE TESTING W REFLEX): HIV: NONREACTIVE

## 2014-08-10 LAB — MICROALBUMIN / CREATININE URINE RATIO
Creatinine, Urine: 137.1 mg/dL
MICROALB UR: 0.6 mg/dL (ref <2.0)
MICROALB/CREAT RATIO: 4.4 mg/g (ref 0.0–30.0)

## 2014-08-13 ENCOUNTER — Telehealth: Payer: Self-pay | Admitting: *Deleted

## 2014-08-13 NOTE — Telephone Encounter (Signed)
Left voice message with normal lab  If any question please return call

## 2014-08-13 NOTE — Telephone Encounter (Signed)
-----   Message from Lora PaulaJosalyn C [name], MD sent at 08/10/2014  8:53 AM EST ----- Normal labs including screening HIV

## 2014-09-02 ENCOUNTER — Ambulatory Visit: Payer: Self-pay | Attending: Family Medicine

## 2014-10-16 ENCOUNTER — Encounter: Payer: Self-pay | Admitting: Family Medicine

## 2014-10-16 ENCOUNTER — Ambulatory Visit: Payer: Self-pay | Attending: Family Medicine | Admitting: Family Medicine

## 2014-10-16 VITALS — BP 117/78 | HR 78 | Temp 98.3°F | Resp 16 | Ht 65.0 in | Wt 185.0 lb

## 2014-10-16 DIAGNOSIS — I10 Essential (primary) hypertension: Secondary | ICD-10-CM

## 2014-10-16 DIAGNOSIS — L03032 Cellulitis of left toe: Secondary | ICD-10-CM

## 2014-10-16 DIAGNOSIS — L03031 Cellulitis of right toe: Secondary | ICD-10-CM

## 2014-10-16 DIAGNOSIS — B351 Tinea unguium: Secondary | ICD-10-CM

## 2014-10-16 DIAGNOSIS — L600 Ingrowing nail: Secondary | ICD-10-CM | POA: Insufficient documentation

## 2014-10-16 DIAGNOSIS — E119 Type 2 diabetes mellitus without complications: Secondary | ICD-10-CM

## 2014-10-16 LAB — GLUCOSE, POCT (MANUAL RESULT ENTRY): POC GLUCOSE: 228 mg/dL — AB (ref 70–99)

## 2014-10-16 LAB — POCT GLYCOSYLATED HEMOGLOBIN (HGB A1C): Hemoglobin A1C: 7.2

## 2014-10-16 MED ORDER — MUPIROCIN 2 % EX OINT: 1.0000 "application " | TOPICAL_OINTMENT | Freq: Three times a day (TID) | CUTANEOUS | Status: DC

## 2014-10-16 NOTE — Progress Notes (Signed)
F/U DM HTN and toe nail fungal  Stated glucose running 110-144  No Hx tobacco

## 2014-10-16 NOTE — Patient Instructions (Addendum)
[name],  Thank you for coming in today.   1. Diabetes:  Sugars are well controlled continue current regimen  2. HTN: BP well controlled  Continue lisinopril   3. Ingrown toenails and mild erythema and pus Podiatry referral  Place a cotton wedging or dental floss underneath the lateral nail plate to separate the nail plate from the lateral nail fold, thereby relieving pressure. ?Soak the affected foot in warm, soapy water for 10 to 20 minutes, three times per day for one to two weeks, pushing the lateral nailfold away from the nail plate [21][20]. Alternatively, a solution of water mixed with 1 to 2 teaspoons of Epsom salts can be used   Use mupirocin ointment-antibiotic ointment after soaking  Avoid cutting nails too short   F/u in 3 months for diabetes  Dr. Armen PickupFunches   Ua del pie encarnada (Ingrown Toenail) El profesional que lo asiste le ha diagnosticado que usted tiene la ua del pie encarnada. Esto se produce cuando un borde afilado de la ua crece dentro de la piel. Entre las causas, se incluyen cortarse las uas muy atrs, o usar zapatos que no Contractorcalcen bien. Actividades que impliquen detenerse rpidamente (bsquet, tenis) causan traumatismos en los dedos que ocasionan la ua encarnada. INSTRUCCIONES PARA EL CUIDADO DOMICILIARIO  Remoje todo el pie en agua tibia jabonosa durante 20 minutos, tres veces por da.  Puede separar el borde de la ua de la piel que duele, colocando un pequeo trozo de algodn bajo la esquina de la ua. Tenga cuidado de no hundirla Personal assistant(traumatizar) y Journalist, newspaperocasionar una lesin mayor.  Use zapatos que calcen bien. Mientras tenga este problema, puede ser til usar sandalias.  Corte las uas regularmente y con cuidado. Corte las uas en lnea recta y no en curva. Esto evitar que la piel lesione las esquinas de las uas.  Mantenga los pies limpios y secos.  Puede serle til International Business Machinesusar muletas a comienzo del tratamiento siente dolor al caminar.  Si le  prescriben antibiticos, tmelos tal como se le indic.  Regrese para Scientist, physiologicalcontrolar la herida dentro de [address] das, o segn le hayan indicado.  Utilice los medicamentos de venta libre o de prescripcin para Chief Technology Officerel dolor, Environmental health practitionerel malestar o la [place]bre, segn se lo indique el profesional que lo asiste. SOLICITE ATENCIN MDICA DE INMEDIATO SI:  Tiene fiebre.  Aumenta el dolor, el enrojecimiento, la hinchazn o Haematologistel calor en el lugar de la herida.  El dedo no se cura en el trmino de [address] das. Si el tratamiento conservador no tiene xito, ser Temple-Inlandnecesario que se someta a la extirpacin [place]quirrgica de una porcin o de toda la ua. EST SEGURO QUE:   Comprende las instrucciones para el alta mdica.  Controlar su enfermedad.  Solicitar atencin mdica de inmediato segn las indicaciones. Document Released: 05/31/2005 Document Revised: 08/23/2011 [facility] Patient Information 2015 [place], [place]. This information is not intended to replace advice given to you by your health care provider. Make sure you discuss any questions you have with your health care provider.

## 2014-10-16 NOTE — Progress Notes (Signed)
   Subjective:    Patient ID: Isabella PollackLeticia Lavalle Joyce, female    DOB: 1971/06/08, 44 y.o.   MRN: [id number] CC: f/u HTN,. DM2, toenail fungus  HPI  1. CHRONIC HYPERTENSION  Disease Monitoring  Blood pressure range: not checking   Chest pain: no   Dyspnea: no   Claudication: no   Medication compliance: yes  Medication Side Effects  Lightheadedness: no   Urinary frequency: no   Edema: no    2. CHRONIC DIABETES  Disease Monitoring  Blood Sugar Ranges:   Polyuria: no   Visual problems: no   Medication Compliance: yes  Medication Side Effects  Hypoglycemia: no   3. Toenail fungus:  Both great toes. With pain and swelling and mild drainage from L great toe nail bed.   Soc Hx: non smoker  Review of Systems  Constitutional: Negative for fever and chills.  Endocrine: Negative.        Objective:   Physical Exam BP 117/78 mmHg  Pulse 78  Temp(Src) 98.3 F (36.8 C) (Oral)  Resp 16  Ht 5\' 5"  (1.651 m)  Wt 185 lb (83.915 kg)  BMI 30.79 kg/m2  SpO2 98% General appearance: alert, cooperative and no distress Lungs: clear to auscultation bilaterally Heart: regular rate and rhythm, S1, S2 normal, no murmur, click, rub or gallop Extremities: extremities normal, atraumatic, no cyanosis or edema  Lab Results  Component Value Date   HGBA1C 6.90 08/09/2014   CBG 288      Assessment & Plan:

## 2014-10-17 NOTE — Assessment & Plan Note (Signed)
2. HTN: BP well controlled  Continue lisinopril

## 2014-10-17 NOTE — Assessment & Plan Note (Signed)
  3. Ingrown toenails and mild erythema and pus Podiatry referral  Place a cotton wedging or dental floss underneath the lateral nail plate to separate the nail plate from the lateral nail fold, thereby relieving pressure. ?Soak the affected foot in warm, soapy water for 10 to 20 minutes, three times per day for one to two weeks, pushing the lateral nailfold away from the nail plate [16][20]. Alternatively, a solution of water mixed with 1 to 2 teaspoons of Epsom salts can be used   Use mupirocin ointment-antibiotic ointment after soaking  Avoid cutting nails too short

## 2014-10-17 NOTE — Assessment & Plan Note (Signed)
  1. Diabetes:  Sugars are well controlled continue current regimen

## 2014-11-01 ENCOUNTER — Telehealth: Payer: Self-pay | Admitting: Family Medicine

## 2014-11-01 NOTE — Telephone Encounter (Signed)
Dalia patient's daughter called regarding her mother's referral to podiatry, please contact her at different phone number provided.

## 2014-11-04 NOTE — Telephone Encounter (Signed)
I spoke to patient's son and I give him the info about foot doctor

## 2014-11-20 ENCOUNTER — Ambulatory Visit: Payer: No Typology Code available for payment source | Admitting: Podiatry

## 2014-11-20 ENCOUNTER — Encounter: Payer: Self-pay | Admitting: Podiatry

## 2014-11-20 VITALS — BP 143/95 | HR 73 | Temp 97.7°F | Resp 14

## 2014-11-20 DIAGNOSIS — L600 Ingrowing nail: Secondary | ICD-10-CM

## 2014-11-20 NOTE — Patient Instructions (Addendum)
I will need to contact your primary care physician for clearance to remove ingrown toenails as you are diabetic with elevated A1c Will notify you upon clearance from your medical doctor

## 2014-11-20 NOTE — Progress Notes (Signed)
   Subjective:    Patient ID: Isabella Joyce, female    DOB: 07/03/1970, 44 y.o.   MRN: [id number]  HPI   N- pain,soreness, sometimes swelling  L- B/L great toes  D-1 1/2 month  O-gradual  C-sharpe  A- shoes  T-physician gave her cream(Mupirocin 2%)to use, soaking in warm water,did calm the pain.   Patient also has a history of paronychia and currently has a prescription for terbinafine 250 mg #28 1 daily Rx Don 11/13/2014. Previously patient also has used mupirocin ointment Patient is a diabetic with more recent A1c of 7.2 Patient presents here today with B/L lateral side  toe pain    Review of Systems  HENT: Positive for ear pain and hearing loss.        Sore throat, and ringing in ears  Cardiovascular: Positive for leg swelling.  Skin:       Change in nails  Allergic/Immunologic: Positive for environmental allergies.       Objective:   Physical Exam  Orientated 3 patient who speaks no English presents with her son of some the treatment room was translating for today  Vascular: DP and PT pulses 2/4 bilaterally Capillary reflex immediate bilaterally  Neurological: Sensation to 10 g monofilament wire intact 5/5 bilaterally Ankle reflex equal and reactive bilaterally Vibratory sensation intact bilaterally  Dermatological: Texture and turgor within normal limits bilaterally No open lesions noted bilaterally The lateral margins of the right and left hallux toenails are tender to direct palpation without any erythema, edema, drainage, warmth  Musculoskeletal: No deformities noted      Assessment & Plan:   Assessment: Satisfactory neurovascular status Ingrowing lateral margin the right and left hallux toenails without any clinical sign of infection  Plan: Will obtain medical clearance prior to offereimg patient phenol matricectomy as result of the elevated A1c at 7.2 Contact primary care physician who manages the diabetes for medical clearance to  perform a phenol matricectomy of the lateral margin the right and left hallux nails

## 2014-11-27 ENCOUNTER — Encounter: Payer: Self-pay | Admitting: *Deleted

## 2014-11-29 ENCOUNTER — Encounter: Payer: Self-pay | Admitting: Family Medicine

## 2014-11-29 DIAGNOSIS — B351 Tinea unguium: Secondary | ICD-10-CM

## 2014-11-29 DIAGNOSIS — L600 Ingrowing nail: Principal | ICD-10-CM

## 2014-11-29 NOTE — Assessment & Plan Note (Signed)
Patient planning to have b/l hallux phenol matricectomy  Pre-op CBC ordered Cleared surgically, letter written

## 2015-03-03 ENCOUNTER — Encounter: Payer: Self-pay | Admitting: Family Medicine

## 2015-03-03 ENCOUNTER — Ambulatory Visit: Payer: Self-pay | Attending: Family Medicine | Admitting: Family Medicine

## 2015-03-03 VITALS — BP 111/77 | HR 62 | Temp 97.7°F | Resp 16 | Ht 65.0 in | Wt 185.0 lb

## 2015-03-03 DIAGNOSIS — I10 Essential (primary) hypertension: Secondary | ICD-10-CM

## 2015-03-03 DIAGNOSIS — E119 Type 2 diabetes mellitus without complications: Secondary | ICD-10-CM

## 2015-03-03 DIAGNOSIS — L600 Ingrowing nail: Secondary | ICD-10-CM

## 2015-03-03 DIAGNOSIS — B351 Tinea unguium: Secondary | ICD-10-CM

## 2015-03-03 DIAGNOSIS — Z23 Encounter for immunization: Secondary | ICD-10-CM | POA: Insufficient documentation

## 2015-03-03 LAB — GLUCOSE, POCT (MANUAL RESULT ENTRY): POC Glucose: 178 mg/dl — AB (ref 70–99)

## 2015-03-03 LAB — POCT GLYCOSYLATED HEMOGLOBIN (HGB A1C): Hemoglobin A1C: 7.4

## 2015-03-03 MED ORDER — LISINOPRIL 20 MG PO TABS: 20.0000 mg | ORAL_TABLET | Freq: Every day | ORAL | Status: DC

## 2015-03-03 MED ORDER — ATORVASTATIN CALCIUM 20 MG PO TABS: 20.0000 mg | ORAL_TABLET | Freq: Every day | ORAL | Status: DC

## 2015-03-03 MED ORDER — DILTIAZEM HCL ER 180 MG PO CP24: 180.0000 mg | ORAL_CAPSULE | Freq: Every day | ORAL | Status: DC

## 2015-03-03 MED ORDER — MUPIROCIN 2 % EX OINT: 1.0000 "application " | TOPICAL_OINTMENT | Freq: Three times a day (TID) | CUTANEOUS | Status: DC

## 2015-03-03 MED ORDER — METFORMIN HCL ER 500 MG PO TB24: 1000.0000 mg | ORAL_TABLET | Freq: Two times a day (BID) | ORAL | Status: DC

## 2015-03-03 NOTE — Progress Notes (Signed)
Patient ID: Isabella Joyce, female   DOB: August 25, 1970, 44 y.o.   MRN: [id number]   Subjective:  Patient ID: Isabella Joyce, female    DOB: [birth year]  Age: 44 y.o. MRN: [id number]  CC: Diabetes and Follow-up   HPI Cynthia Cogle presents for diabetes f/u  1. CHRONIC DIABETES  Disease Monitoring  Blood Sugar Ranges: 130-160  Polyuria: no   Visual problems: no   Medication Compliance: yes  Medication Side Effects  Hypoglycemia: no   2. Ingrown R great toenail: painful with swelling and mild redness. Mupirocin helps. Had similar problem on L that resolved on its own. Soaking toe.   Social History  Substance Use Topics  . Smoking status: Never Smoker   . Smokeless tobacco: Never Used  . Alcohol Use: No    Outpatient Prescriptions Prior to Visit  Medication Sig Dispense Refill  . atorvastatin (LIPITOR) 20 MG tablet Take 1 tablet (20 mg total) by mouth daily. 90 tablet 3  . Blood Glucose Monitoring Suppl (TRUERESULT BLOOD GLUCOSE) W/DEVICE KIT 1 each by Does not apply route 3 (three) times daily before meals. 1 each 0  . diltiazem (DILACOR XR) 180 MG 24 hr capsule Take 1 capsule (180 mg total) by mouth daily. 30 capsule 5  . glucose blood (TRUETEST TEST) test strip 1 each by Other route 2 (two) times daily. Use as instructed 100 each 12  . lisinopril (PRINIVIL,ZESTRIL) 20 MG tablet Take 1 tablet (20 mg total) by mouth daily. 30 tablet 5  . metFORMIN (GLUCOPHAGE) 500 MG tablet Take 1 tablet (500 mg total) by mouth 2 (two) times daily with a meal. 180 tablet 1  . mupirocin ointment (BACTROBAN) 2 % Apply 1 application topically 3 (three) times daily. To toenails 22 g 2  . TRUEPLUS LANCETS 28G MISC 1 each by Does not apply route 2 (two) times daily. 100 each 12  . fluticasone (FLONASE) 50 MCG/ACT nasal spray Place 2 sprays into both nostrils daily. (Patient not taking: Reported on 03/03/2015) 16 g 5  . terbinafine (LAMISIL) 250 MG tablet Take 1 tablet (250 mg total)  by mouth daily. (Patient not taking: Reported on 03/03/2015) 84 tablet 0   No facility-administered medications prior to visit.    ROS Review of Systems  Constitutional: Negative for fever and chills.  Eyes: Negative for visual disturbance.  Respiratory: Negative for shortness of breath.   Cardiovascular: Negative for chest pain.  Gastrointestinal: Negative for abdominal pain and blood in stool.  Musculoskeletal: Negative for back pain and arthralgias.  Skin: Positive for wound. Negative for rash.  Allergic/Immunologic: Negative for immunocompromised state.  Hematological: Negative for adenopathy. Does not bruise/bleed easily.  Psychiatric/Behavioral: Negative for suicidal ideas and dysphoric mood.    Objective:  BP 111/77 mmHg  Pulse 62  Temp(Src) 97.7 F (36.5 C) (Oral)  Resp 16  Ht '5\' 5"'  (1.651 m)  Wt 185 lb (83.915 kg)  BMI 30.79 kg/m2  SpO2 99%  BP/Weight 03/03/2015 08/20/1015 10/12/256  Systolic BP - 527 782  Diastolic BP - 95 78  Wt. (Lbs) 185 - 185  BMI 30.79 - 30.79   Physical Exam  Constitutional: She is oriented to person, place, and time. She appears well-developed and well-nourished. No distress.  Eyes: Conjunctivae and EOM are normal. Pupils are equal, round, and reactive to light.  Cardiovascular: Normal rate, regular rhythm, normal heart sounds and intact distal pulses.   Pulmonary/Chest: Effort normal and breath sounds normal.  Musculoskeletal: She exhibits tenderness. She exhibits  no edema.       Feet:  Neurological: She is alert and oriented to person, place, and time.  Skin: Skin is warm and dry. No rash noted.  Psychiatric: She has a normal mood and affect.   Lab Results  Component Value Date   HGBA1C 7.40 03/03/2015   CBG 178   Assessment & Plan:   [name] was seen today for diabetes and follow-up.  Diagnoses and all orders for this visit:  Type 2 diabetes mellitus without complication -     POCT glycosylated hemoglobin (Hb A1C) -      POCT glucose (manual entry) -     atorvastatin (LIPITOR) 20 MG tablet; Take 1 tablet (20 mg total) by mouth daily. -     metFORMIN (GLUCOPHAGE XR) 500 MG 24 hr tablet; Take 2 tablets (1,000 mg total) by mouth 2 (two) times daily.  Essential hypertension -     diltiazem (DILACOR XR) 180 MG 24 hr capsule; Take 1 capsule (180 mg total) by mouth daily. -     lisinopril (PRINIVIL,ZESTRIL) 20 MG tablet; Take 1 tablet (20 mg total) by mouth daily.  Onychomycosis with ingrown toenail -     mupirocin ointment (BACTROBAN) 2 %; Apply 1 application topically 3 (three) times daily. To toenails  Ingrown right greater toenail -     Ambulatory referral to Podiatry  Other orders -     Flu Vaccine QUAD 36+ mos IM    Meds ordered this encounter  Medications  . atorvastatin (LIPITOR) 20 MG tablet    Sig: Take 1 tablet (20 mg total) by mouth daily.    Dispense:  30 tablet    Refill:  5  . diltiazem (DILACOR XR) 180 MG 24 hr capsule    Sig: Take 1 capsule (180 mg total) by mouth daily.    Dispense:  30 capsule    Refill:  5  . lisinopril (PRINIVIL,ZESTRIL) 20 MG tablet    Sig: Take 1 tablet (20 mg total) by mouth daily.    Dispense:  30 tablet    Refill:  5  . mupirocin ointment (BACTROBAN) 2 %    Sig: Apply 1 application topically 3 (three) times daily. To toenails    Dispense:  22 g    Refill:  2  . metFORMIN (GLUCOPHAGE XR) 500 MG 24 hr tablet    Sig: Take 2 tablets (1,000 mg total) by mouth 2 (two) times daily.    Dispense:  120 tablet    Refill:  5    Follow-up: Return in about 3 months (around 06/02/2015) for diabetes .   Boykin Nearing MD

## 2015-03-03 NOTE — Assessment & Plan Note (Signed)
A: A1c continues to rise Med:

## 2015-03-03 NOTE — Patient Instructions (Addendum)
Susan was seen today for diabetes and follow-up.  Diagnoses and all orders for this visit:  Type 2 diabetes mellitus without complication -     POCT glycosylated hemoglobin (Hb A1C) -     POCT glucose (manual entry) -     atorvastatin (LIPITOR) 20 MG tablet; Take 1 tablet (20 mg total) by mouth daily. -     metFORMIN (GLUCOPHAGE XR) 500 MG 24 hr tablet; Take 2 tablets (1,000 mg total) by mouth 2 (two) times daily.  Essential hypertension -     diltiazem (DILACOR XR) 180 MG 24 hr capsule; Take 1 capsule (180 mg total) by mouth daily. -     lisinopril (PRINIVIL,ZESTRIL) 20 MG tablet; Take 1 tablet (20 mg total) by mouth daily.  Onychomycosis with ingrown toenail -     mupirocin ointment (BACTROBAN) 2 %; Apply 1 application topically 3 (three) times daily. To toenails  Ingrown right greater toenail -     Ambulatory referral to Podiatry  Other orders -     Flu Vaccine QUAD 36+ mos IM   Please apply for [place] discount and orange card, you can also inquire if any of your medications are on the PASS (medications assistance) list.   F/u in 3 months for diabetes and HTN  Ua del pie encarnada (Ingrown Toenail) El profesional que lo asiste le ha diagnosticado que usted tiene la ua del pie encarnada. Esto se produce cuando un borde afilado de la ua crece dentro de la piel. Entre las causas, se incluyen cortarse las uas muy atrs, o usar zapatos que no Contractor. Actividades que impliquen detenerse rpidamente (bsquet, tenis) causan traumatismos en los dedos que ocasionan la ua encarnada. INSTRUCCIONES PARA EL CUIDADO DOMICILIARIO  Remoje todo el pie en agua tibia jabonosa durante 20 minutos, tres veces por da.  Puede separar el borde de la ua de la piel que duele, colocando un pequeo trozo de algodn bajo la esquina de la ua. Tenga cuidado de no hundirla Personal assistant) y Journalist, newspaper.  Use zapatos que calcen bien. Mientras tenga este problema, puede ser til  usar sandalias.  Corte las uas regularmente y con cuidado. Corte las uas en lnea recta y no en curva. Esto evitar que la piel lesione las esquinas de las uas.  Mantenga los pies limpios y secos.  Puede serle til [employer] a comienzo del tratamiento siente dolor al caminar.  Si le prescriben antibiticos, tmelos tal como se le indic.  Regrese para Scientist, physiological herida dentro de [address], o segn le hayan indicado.  Utilice los medicamentos de venta libre o de prescripcin para Chief Technology Officer, Environmental health practitioner o la [place], segn se lo indique el profesional que lo asiste. SOLICITE ATENCIN MDICA DE INMEDIATO SI:  Tiene fiebre.  Aumenta el dolor, el enrojecimiento, la hinchazn o Haematologist de la herida.  El dedo no se cura en el trmino de [address]. Si el tratamiento conservador no tiene xito, ser [employer] se someta a la extirpacin [place] de una porcin o de toda la ua. EST SEGURO QUE:   Comprende las instrucciones para el alta mdica.  Controlar su enfermedad.  Solicitar atencin mdica de inmediato segn las indicaciones. Document Released: 05/31/2005 Document Revised: 08/23/2011 [facility] Patient Information 2015 [place], [place]. This information is not intended to replace advice given to you by your health care provider. Make sure you discuss any questions you have with your health care provider.

## 2015-03-03 NOTE — Progress Notes (Signed)
F/U DM  Glucose running 110 -130 No Hx tobacco

## 2015-03-03 NOTE — Assessment & Plan Note (Signed)
A: ingrown R great toenail with pain and swelling, distal medial. Mupirocin ointment helps. There is inflammation but no evidence of infection  P: Continue prn mupirocin Podiatry referral, encouraged patient to apply for orange card Soak toe and place cotton wick between toe and skin

## 2015-04-17 ENCOUNTER — Ambulatory Visit: Payer: Self-pay | Attending: Family Medicine

## 2015-05-15 ENCOUNTER — Other Ambulatory Visit: Payer: Self-pay | Admitting: Pharmacist

## 2015-05-15 MED ORDER — GLUCOSE BLOOD VI STRP: ORAL_STRIP | Status: DC

## 2015-05-15 MED ORDER — TRUE METRIX METER W/DEVICE KIT: PACK | Status: DC

## 2015-05-19 ENCOUNTER — Ambulatory Visit: Payer: Self-pay | Attending: Podiatry

## 2015-08-08 MED FILL — TRUE METRIX TEST STRIP: 50 days supply | Qty: 100 | Fill #1

## 2015-09-05 ENCOUNTER — Other Ambulatory Visit: Payer: Self-pay | Admitting: Family Medicine

## 2015-09-05 MED FILL — DILTIAZEM 24HR CD 180 MG CA: 180 | 30 days supply | Qty: 30 | Fill #0

## 2015-09-05 MED FILL — LISINOPRIL 20 MG TABLET: 20 | 30 days supply | Qty: 30 | Fill #0

## 2015-10-06 ENCOUNTER — Other Ambulatory Visit: Payer: Self-pay | Admitting: Family Medicine

## 2015-10-08 MED FILL — METFORMIN HCL ER 500 MG TAB: 500 | 30 days supply | Qty: 120 | Fill #0

## 2015-10-10 ENCOUNTER — Other Ambulatory Visit: Payer: Self-pay | Admitting: Family Medicine

## 2015-10-10 MED FILL — ?LISINOPRIL 20 MG TABLET: 20 | 30 days supply | Qty: 30 | Fill #0

## 2015-10-10 MED FILL — DILTIAZEM 24HR CD 180 MG CA: 180 | 30 days supply | Qty: 30 | Fill #0

## 2015-10-20 ENCOUNTER — Ambulatory Visit: Payer: Self-pay | Attending: Family Medicine | Admitting: Family Medicine

## 2015-10-20 ENCOUNTER — Encounter: Payer: Self-pay | Admitting: Family Medicine

## 2015-10-20 VITALS — BP 128/91 | HR 72 | Temp 97.7°F | Resp 16 | Ht 66.0 in | Wt 181.0 lb

## 2015-10-20 DIAGNOSIS — H9311 Tinnitus, right ear: Secondary | ICD-10-CM

## 2015-10-20 DIAGNOSIS — Z7984 Long term (current) use of oral hypoglycemic drugs: Secondary | ICD-10-CM | POA: Insufficient documentation

## 2015-10-20 DIAGNOSIS — L239 Allergic contact dermatitis, unspecified cause: Secondary | ICD-10-CM | POA: Insufficient documentation

## 2015-10-20 DIAGNOSIS — E119 Type 2 diabetes mellitus without complications: Secondary | ICD-10-CM | POA: Insufficient documentation

## 2015-10-20 DIAGNOSIS — Z79899 Other long term (current) drug therapy: Secondary | ICD-10-CM | POA: Insufficient documentation

## 2015-10-20 DIAGNOSIS — I10 Essential (primary) hypertension: Secondary | ICD-10-CM | POA: Insufficient documentation

## 2015-10-20 DIAGNOSIS — L200 Besnier's prurigo: Secondary | ICD-10-CM

## 2015-10-20 LAB — POCT GLYCOSYLATED HEMOGLOBIN (HGB A1C): Hemoglobin A1C: 6.5

## 2015-10-20 LAB — GLUCOSE, POCT (MANUAL RESULT ENTRY): POC Glucose: 112 mg/dl — AB (ref 70–99)

## 2015-10-20 MED ORDER — TRIAMCINOLONE ACETONIDE 0.5 % EX OINT: 1.0000 "application " | TOPICAL_OINTMENT | Freq: Two times a day (BID) | CUTANEOUS | Status: DC

## 2015-10-20 MED ORDER — LISINOPRIL 40 MG PO TABS: 40.0000 mg | ORAL_TABLET | Freq: Every day | ORAL | Status: DC

## 2015-10-20 MED ORDER — METFORMIN HCL ER 500 MG PO TB24
500.0000 mg | ORAL_TABLET | Freq: Two times a day (BID) | ORAL | Status: DC
Start: 2015-10-20 — End: 2016-05-28

## 2015-10-20 MED ORDER — CETIRIZINE HCL 10 MG PO TABS: 10.0000 mg | ORAL_TABLET | Freq: Every day | ORAL | Status: DC

## 2015-10-20 MED ORDER — LISINOPRIL 40 MG PO TABS: 20.0000 mg | ORAL_TABLET | Freq: Every day | ORAL | Status: DC

## 2015-10-20 MED ORDER — DILTIAZEM HCL ER COATED BEADS 180 MG PO CP24: 180.0000 mg | ORAL_CAPSULE | Freq: Every day | ORAL | Status: DC

## 2015-10-20 MED FILL — ?CETIRIZINE HCL 10 MG TABLE: 10 | 30 days supply | Qty: 30 | Fill #0

## 2015-10-20 MED FILL — TRIAMCINOLONE 0.5% OINTMENT: 0.5 | 15 days supply | Qty: 30 | Fill #0

## 2015-10-20 NOTE — Progress Notes (Signed)
Patient ID: Isabella Joyce, female   DOB: 06-23-1970, 45 y.o.   MRN: [id number]   Subjective:  Patient ID: Isabella Joyce, female    DOB: [birth year]  Age: 45 y.o. MRN: [id number] Spanish interpreter used   CC: Diabetes and Hypertension   HPI Isabella Joyce presents for diabetes f/u  1. CHRONIC DIABETES  Disease Monitoring  Blood Sugar Ranges: 85-140  Polyuria: no   Visual problems: no   Medication Compliance: taking 500 mg of metformin BID  Medication Side Effects  Hypoglycemia: no   2. CHRONIC HYPERTENSION  Disease Monitoring  Blood pressure range: not checking    Chest pain: no   Dyspnea: no   Claudication: no   Medication compliance: yes  Medication Side Effects  Lightheadedness: no   Urinary frequency: no   Edema: no    3. Ringing in ears: with hearing loss. Worsening x one year. Worse on R side. Last saw audiologist one year ago. Wearing hearing aids for past 2 years. Also with R temple and forehead headache, associated with sensitivity to touch and dizziness. No fever or chills.   4. Skin rash: x one week. Father, daughter and son-in-law with similar rash. Red splotches on body that itch. Worsen in afternoon. No new medications,  foods, clothes or skin products.   Social History  Substance Use Topics  . Smoking status: Never Smoker   . Smokeless tobacco: Never Used  . Alcohol Use: No    Outpatient Prescriptions Prior to Visit  Medication Sig Dispense Refill  . atorvastatin (LIPITOR) 20 MG tablet Take 1 tablet (20 mg total) by mouth daily. 30 tablet 5  . Blood Glucose Monitoring Suppl (TRUE METRIX METER) W/DEVICE KIT USE AS DIRECTED 2 TIMES DAILY 1 kit 0  . diltiazem (CARDIZEM CD) 180 MG 24 hr capsule TAKE ONE CAPSULE BY MOUTH DAILY 30 capsule 0  . glucose blood (TRUE METRIX BLOOD GLUCOSE TEST) test strip USE AS DIRECTED 2 TIMES DAILY 100 each 12  . lisinopril (PRINIVIL,ZESTRIL) 20 MG tablet TAKE 1 TABLET BY MOUTH DAILY 30 tablet 0  .  metFORMIN (GLUCOPHAGE-XR) 500 MG 24 hr tablet TAKE 2 TABLETS BY MOUTH 2 TIMES DAILY 120 tablet 0  . mupirocin ointment (BACTROBAN) 2 % Apply 1 application topically 3 (three) times daily. To toenails 22 g 2  . TRUEPLUS LANCETS 28G MISC 1 each by Does not apply route 2 (two) times daily. 100 each 12   No facility-administered medications prior to visit.    ROS Review of Systems  Constitutional: Negative for fever and chills.  HENT: Positive for ear pain, hearing loss and tinnitus.   Eyes: Negative for visual disturbance.  Respiratory: Negative for shortness of breath.   Cardiovascular: Negative for chest pain.  Gastrointestinal: Negative for abdominal pain and blood in stool.  Musculoskeletal: Negative for back pain and arthralgias.  Skin: Positive for rash. Negative for wound.  Allergic/Immunologic: Negative for immunocompromised state.  Hematological: Negative for adenopathy. Does not bruise/bleed easily.  Psychiatric/Behavioral: Negative for suicidal ideas and dysphoric mood.    Objective:  BP 128/91 mmHg  Pulse 72  Temp(Src) 97.7 F (36.5 C) (Oral)  Resp 16  Ht '5\' 6"'  (1.676 m)  Wt 181 lb (82.101 kg)  BMI 29.23 kg/m2  SpO2 99%  BP/Weight 10/20/2015 12/08/348 0/02/3817  Systolic BP 299 371 696  Diastolic BP 91 77 95  Wt. (Lbs) 181 185 -  BMI 29.23 30.79 -   Physical Exam  Constitutional: She is oriented to person,  place, and time. She appears well-developed and well-nourished. No distress.  Eyes: Conjunctivae and EOM are normal. Pupils are equal, round, and reactive to light.  Cardiovascular: Normal rate, regular rhythm, normal heart sounds and intact distal pulses.   Pulmonary/Chest: Effort normal and breath sounds normal.  Musculoskeletal: She exhibits tenderness. She exhibits no edema.  Neurological: She is alert and oriented to person, place, and time.  Skin: Skin is warm and dry. Rash noted. Rash is macular. There is erythema.  Erythematous macular patches on arms  and back   Psychiatric: She has a normal mood and affect.   Lab Results  Component Value Date   HGBA1C 7.40 03/03/2015   Lab Results  Component Value Date   HGBA1C 6.5 10/20/2015   CBG 112   Assessment & Plan:   Isabella Joyce was seen today for diabetes and hypertension.  Diagnoses and all orders for this visit:  Type 2 diabetes mellitus without complication, unspecified long term insulin use status (HCC) -     POCT glycosylated hemoglobin (Hb A1C) -     POCT glucose (manual entry) -     Microalbumin/Creatinine Ratio, Urine -     metFORMIN (GLUCOPHAGE-XR) 500 MG 24 hr tablet; Take 1 tablet (500 mg total) by mouth 2 (two) times daily. -     Ambulatory referral to Ophthalmology -     COMPLETE METABOLIC PANEL WITH GFR -     Lipid Panel  Essential hypertension -     diltiazem (CARDIZEM CD) 180 MG 24 hr capsule; Take 1 capsule (180 mg total) by mouth daily. -     Discontinue: lisinopril (PRINIVIL,ZESTRIL) 40 MG tablet; Take 0.5 tablets (20 mg total) by mouth daily. -     lisinopril (PRINIVIL,ZESTRIL) 40 MG tablet; Take 1 tablet (40 mg total) by mouth daily. -     COMPLETE METABOLIC PANEL WITH GFR -     Lipid Panel  Allergic dermatitis -     cetirizine (ZYRTEC) 10 MG tablet; Take 1 tablet (10 mg total) by mouth daily. -     triamcinolone ointment (KENALOG) 0.5 %; Apply 1 application topically 2 (two) times daily.    No orders of the defined types were placed in this encounter.    Follow-up: No Follow-up on file.   Boykin Nearing MD

## 2015-10-20 NOTE — Progress Notes (Signed)
F/U DM HTN  Glucose running 85-140 Taking medication daily  Complaining of ear ache x 1 year  Stated rash on back and arms x 8 day  Husband has same rash. No tobacco user  Pain scale #3 No suicidal thoughts in the past two weeks

## 2015-10-20 NOTE — Assessment & Plan Note (Signed)
A: elevated BP Med: compliant P: Increase lisinopril to 40 mg daily  Continue diltiazem 180 mg daily

## 2015-10-20 NOTE — Patient Instructions (Addendum)
Alethia BertholdLeticia was seen today for diabetes and hypertension.  Diagnoses and all orders for this visit:  Type 2 diabetes mellitus without complication, unspecified long term insulin use status (HCC) -     POCT glycosylated hemoglobin (Hb A1C) -     POCT glucose (manual entry) -     Microalbumin/Creatinine Ratio, Urine -     metFORMIN (GLUCOPHAGE-XR) 500 MG 24 hr tablet; Take 1 tablet (500 mg total) by mouth 2 (two) times daily. -     Ambulatory referral to Ophthalmology  Essential hypertension -     diltiazem (CARDIZEM CD) 180 MG 24 hr capsule; Take 1 capsule (180 mg total) by mouth daily. -     Discontinue: lisinopril (PRINIVIL,ZESTRIL) 40 MG tablet; Take 0.5 tablets (20 mg total) by mouth daily. -     lisinopril (PRINIVIL,ZESTRIL) 40 MG tablet; Take 1 tablet (40 mg total) by mouth daily.  Allergic dermatitis -     cetirizine (ZYRTEC) 10 MG tablet; Take 1 tablet (10 mg total) by mouth daily. -     triamcinolone ointment (KENALOG) 0.5 %; Apply 1 application topically 2 (two) times daily.   Stop diltiazem for the next 10 days to determine if it is causing or contributing to your ringing in your ears.  Call with update and restart after 10 days.   If there was no change with diltiazem stop, then   Stop lisinopril for the next 10 days to determine if it is causing or contributing to your ringing in your ears.  Call with update and restart after 10 days.   F/u in 4 weeks for ringing in ears and headache   Dr. Armen PickupFunches

## 2015-10-20 NOTE — Assessment & Plan Note (Addendum)
Chronic. Normal exam  Trial off diltiazem for 10 days to determine if this is med induced If no improvement Then restart diltiazem  Then trial off lisinopril for 10 days  Consider trigeminal neuralgia given HA component., possible MRI,  and possible treating with tegretol. Will have patient f/u closely for focused visit.

## 2015-10-20 NOTE — Assessment & Plan Note (Signed)
Well controlled on 500 mg of metformin BID Continue current regimen Referral to opthalmology

## 2015-10-21 LAB — COMPLETE METABOLIC PANEL WITH GFR
ALBUMIN: 4.1 g/dL (ref 3.6–5.1)
ALK PHOS: 69 U/L (ref 33–115)
ALT: 32 U/L — ABNORMAL HIGH (ref 6–29)
AST: 19 U/L (ref 10–30)
BILIRUBIN TOTAL: 0.8 mg/dL (ref 0.2–1.2)
BUN: 10 mg/dL (ref 7–25)
CALCIUM: 8.8 mg/dL (ref 8.6–10.2)
CO2: 26 mmol/L (ref 20–31)
Chloride: 104 mmol/L (ref 98–110)
Creat: 0.53 mg/dL (ref 0.50–1.10)
GFR, Est African American: >89 mL/min (ref >=60)
Glucose, Bld: 118 mg/dL — ABNORMAL HIGH (ref 65–99)
POTASSIUM: 3.8 mmol/L (ref 3.5–5.3)
Sodium: 138 mmol/L (ref 135–146)
Total Protein: 6.9 g/dL (ref 6.1–8.1)

## 2015-10-21 LAB — LIPID PANEL
CHOL/HDL RATIO: 2.5 ratio (ref <=5.0)
CHOLESTEROL: 111 mg/dL — AB (ref 125–200)
HDL: 45 mg/dL — AB (ref >=46)
LDL Cholesterol: 51 mg/dL (ref <130)
Triglycerides: 76 mg/dL (ref <150)
VLDL: 15 mg/dL (ref <30)

## 2015-10-21 LAB — MICROALBUMIN / CREATININE URINE RATIO
CREATININE, URINE: 54 mg/dL (ref 20–320)
Microalb Creat Ratio: 9 mcg/mg creat (ref <30)
Microalb, Ur: 0.5 mg/dL

## 2015-10-22 ENCOUNTER — Telehealth: Payer: Self-pay | Admitting: *Deleted

## 2015-10-22 MED ORDER — ATORVASTATIN CALCIUM 40 MG PO TABS: 40.0000 mg | ORAL_TABLET | Freq: Every day | ORAL | Status: DC

## 2015-10-22 NOTE — Telephone Encounter (Signed)
Date of birth verified by pt  Normal CMP results given  Elevated Cholesterol, Increase Lipitor to 40 mg daily  Rx send to [facility] pharmacy  Normal microalbumin  Pt verbalized understanding  Information given in Spanish

## 2015-10-22 NOTE — Telephone Encounter (Signed)
-----   Message from Dessa PhiJosalyn [name], MD sent at 10/22/2015 10:36 AM EDT ----- Normal CMP Elevated cholesterol in diabetes, increase Lipitor to 40 mg daily from 20 mg daily  Normal urine microalbumin

## 2015-10-22 NOTE — Addendum Note (Signed)
Addended by: Dessa PhiFUNCHES, Alesandra Smart on: 10/22/2015 10:37 AM   Modules accepted: Orders, SmartSet

## 2015-11-07 ENCOUNTER — Other Ambulatory Visit: Payer: Self-pay | Admitting: Family Medicine

## 2015-11-07 MED FILL — DILTIAZEM 24HR ER 180 MG CA: 180 | 30 days supply | Qty: 30 | Fill #0

## 2015-11-07 MED FILL — LISINOPRIL 40 MG TABLET: 40 | 30 days supply | Qty: 30 | Fill #0

## 2015-11-07 MED FILL — ?ATORVASTATIN 40MG TABLET: 40 | 30 days supply | Qty: 30 | Fill #0

## 2015-11-07 MED FILL — METFORMIN HCL ER 500 MG TAB: 500 | 30 days supply | Qty: 60 | Fill #0

## 2015-11-07 MED FILL — TRUE METRIX TEST STRIP: 50 days supply | Qty: 100 | Fill #2

## 2015-12-11 MED FILL — TRIAMCINOLONE 0.5% OINTMENT: 0.5 | 15 days supply | Qty: 30 | Fill #0

## 2015-12-11 MED FILL — CARTIA XT 180 MG CAPSULE SA: 180 | 30 days supply | Qty: 30 | Fill #1

## 2015-12-11 MED FILL — ?CETIRIZINE HCL 10 MG TABLE: 10 | 30 days supply | Qty: 30 | Fill #1

## 2015-12-11 MED FILL — ?ATORVASTATIN 40MG TABLET: 40 | 30 days supply | Qty: 30 | Fill #1

## 2015-12-11 MED FILL — METFORMIN HCL ER 500 MG TAB: 500 | 30 days supply | Qty: 60 | Fill #1

## 2015-12-11 MED FILL — LISINOPRIL 40 MG TABLET: 40 | 30 days supply | Qty: 30 | Fill #1

## 2016-01-07 MED FILL — ?ATORVASTATIN 40MG TABLET: 40 | 30 days supply | Qty: 30 | Fill #2

## 2016-01-07 MED FILL — CARTIA XT 180 MG CAPSULE SA: 180 | 30 days supply | Qty: 30 | Fill #2

## 2016-01-07 MED FILL — METFORMIN HCL ER 500 MG TAB: 500 | 30 days supply | Qty: 60 | Fill #2

## 2016-01-07 MED FILL — LISINOPRIL 40 MG TABLET: 40 | 30 days supply | Qty: 30 | Fill #2

## 2016-01-07 MED FILL — ?CETIRIZINE HCL 10 MG TABLE: 10 | 30 days supply | Qty: 30 | Fill #2

## 2016-02-02 ENCOUNTER — Other Ambulatory Visit: Payer: Self-pay | Admitting: Family Medicine

## 2016-02-06 MED FILL — ?ATORVASTATIN 40MG TABLET: 40 | 30 days supply | Qty: 30 | Fill #3

## 2016-02-06 MED FILL — METFORMIN HCL ER 500 MG TAB: 500 | 30 days supply | Qty: 60 | Fill #3

## 2016-02-06 MED FILL — ?CETIRIZINE HCL 10 MG TABLE: 10 | 30 days supply | Qty: 30 | Fill #3

## 2016-02-06 MED FILL — ?LISINOPRIL 40 MG TABLET: 40 MG | 30 days supply | Qty: 30 | Fill #3

## 2016-02-06 MED FILL — CARTIA XT 180 MG CAPSULE SA: 180 | 30 days supply | Qty: 30 | Fill #3

## 2016-03-12 MED FILL — ?LISINOPRIL 40 MG TABLET: 40 MG | 30 days supply | Qty: 30 | Fill #4

## 2016-03-12 MED FILL — CARTIA XT 180 MG CAPSULE SA: 180 | 30 days supply | Qty: 30 | Fill #4

## 2016-03-12 MED FILL — METFORMIN HCL ER 500 MG TAB: 500 | 30 days supply | Qty: 60 | Fill #4

## 2016-03-12 MED FILL — ATORVASTATIN 40 MG TABLET: 40 | 30 days supply | Qty: 30 | Fill #4

## 2016-03-26 ENCOUNTER — Ambulatory Visit: Payer: Self-pay | Attending: Family Medicine

## 2016-04-08 MED FILL — ?ATORVASTATIN 40MG TABLET: 40 | 30 days supply | Qty: 30 | Fill #5

## 2016-04-08 MED FILL — CARTIA XT 180 MG CAPSULE SA: 180 | 30 days supply | Qty: 30 | Fill #5

## 2016-04-08 MED FILL — METFORMIN HCL ER 500 MG TAB: 500 | 30 days supply | Qty: 60 | Fill #5

## 2016-04-08 MED FILL — LISINOPRIL 40 MG TABLET: 40 | 30 days supply | Qty: 30 | Fill #5

## 2016-05-10 MED FILL — TRUE METRIX TEST STRIP: 50 days supply | Qty: 100 | Fill #3

## 2016-05-10 MED FILL — METFORMIN HCL ER 500 MG TAB: 500 | 30 days supply | Qty: 60 | Fill #6

## 2016-05-10 MED FILL — CARTIA XT 180 MG CAPSULE SA: 180 | 30 days supply | Qty: 30 | Fill #6

## 2016-05-10 MED FILL — LISINOPRIL 40 MG TABLET: 40 | 30 days supply | Qty: 30 | Fill #0

## 2016-05-10 MED FILL — ?ATORVASTATIN 40MG TABLET: 40 | 30 days supply | Qty: 30 | Fill #6

## 2016-05-28 ENCOUNTER — Encounter: Payer: Self-pay | Admitting: Family Medicine

## 2016-05-28 ENCOUNTER — Ambulatory Visit: Payer: Self-pay | Attending: Family Medicine | Admitting: Family Medicine

## 2016-05-28 VITALS — BP 133/86 | HR 72 | Temp 97.7°F | Ht 66.0 in | Wt 181.0 lb

## 2016-05-28 DIAGNOSIS — I10 Essential (primary) hypertension: Secondary | ICD-10-CM | POA: Insufficient documentation

## 2016-05-28 DIAGNOSIS — Z23 Encounter for immunization: Secondary | ICD-10-CM

## 2016-05-28 DIAGNOSIS — Z7984 Long term (current) use of oral hypoglycemic drugs: Secondary | ICD-10-CM | POA: Insufficient documentation

## 2016-05-28 DIAGNOSIS — E119 Type 2 diabetes mellitus without complications: Secondary | ICD-10-CM | POA: Insufficient documentation

## 2016-05-28 LAB — GLUCOSE, POCT (MANUAL RESULT ENTRY): POC Glucose: 123 mg/dl — AB (ref 70–99)

## 2016-05-28 LAB — POCT GLYCOSYLATED HEMOGLOBIN (HGB A1C): Hemoglobin A1C: 7

## 2016-05-28 MED ORDER — METFORMIN HCL ER 500 MG PO TB24: ORAL_TABLET | ORAL | 11 refills | Status: DC

## 2016-05-28 MED ORDER — METFORMIN HCL ER 500 MG PO TB24: ORAL_TABLET | ORAL | 5 refills | Status: DC

## 2016-05-28 MED ORDER — ATORVASTATIN CALCIUM 40 MG PO TABS: 40.0000 mg | ORAL_TABLET | Freq: Every day | ORAL | 11 refills | Status: DC

## 2016-05-28 MED ORDER — DILTIAZEM HCL ER COATED BEADS 180 MG PO CP24: 180.0000 mg | ORAL_CAPSULE | Freq: Every day | ORAL | 11 refills | Status: DC

## 2016-05-28 MED ORDER — LISINOPRIL 40 MG PO TABS: 40.0000 mg | ORAL_TABLET | Freq: Every day | ORAL | 11 refills | Status: DC

## 2016-05-28 NOTE — Assessment & Plan Note (Signed)
Controlled Slight rise in A1c Increase metformin to 500 mg in AM and 1000 mg in PM

## 2016-05-28 NOTE — Assessment & Plan Note (Signed)
Controlled. Continue current regimen. 

## 2016-05-28 NOTE — Progress Notes (Signed)
Patient ID: Isabella Joyce, female   DOB: 04/04/1971, 45 y.o.   MRN: [id number]   Subjective:  Patient ID: Isabella Joyce, female    DOB: 1971-01-01  Age: 45 y.o. MRN: [id number] Spanish interpreter used [place] ID # [id number]  CC: Hypertension and Diabetes   HPI Isabella Joyce  1. CHRONIC DIABETES  Disease Monitoring  Blood Sugar Ranges: 100-135   Polyuria: no   Visual problems: no   Medication Compliance: taking 500 mg of metformin BID  Medication Side Effects  Hypoglycemia: no   2. CHRONIC HYPERTENSION  Disease Monitoring  Blood pressure range: not checking    Chest pain: no   Dyspnea: no   Claudication: no   Medication compliance: yes  Medication Side Effects  Lightheadedness: no   Urinary frequency: no   Edema: no     Social History  Substance Use Topics  . Smoking status: Never Smoker  . Smokeless tobacco: Never Used  . Alcohol use No    Outpatient Medications Prior to Visit  Medication Sig Dispense Refill  . atorvastatin (LIPITOR) 40 MG tablet Take 1 tablet (40 mg total) by mouth daily at 6 PM. 90 tablet 3  . Blood Glucose Monitoring Suppl (TRUE METRIX METER) W/DEVICE KIT USE AS DIRECTED 2 TIMES DAILY 1 kit 0  . cetirizine (ZYRTEC) 10 MG tablet Take 1 tablet (10 mg total) by mouth daily. 30 tablet 11  . diltiazem (CARDIZEM CD) 180 MG 24 hr capsule Take 1 capsule (180 mg total) by mouth daily. 30 capsule 11  . glucose blood (TRUE METRIX BLOOD GLUCOSE TEST) test strip USE AS DIRECTED 2 TIMES DAILY 100 each 12  . lisinopril (PRINIVIL,ZESTRIL) 40 MG tablet Take 1 tablet (40 mg total) by mouth daily. 30 tablet 2  . metFORMIN (GLUCOPHAGE-XR) 500 MG 24 hr tablet Take 1 tablet (500 mg total) by mouth 2 (two) times daily. 60 tablet 11  . triamcinolone ointment (KENALOG) 0.5 % APPLY 1 APPLICATION TOPICALLY 2 TIMES DAILY. 30 g 0  . TRUEPLUS LANCETS 28G MISC 1 each by Does not apply route 2 (two) times daily. 100 each 12     No facility-administered medications prior to visit.     ROS Review of Systems  Constitutional: Negative for chills and fever.  HENT: Negative for ear pain, hearing loss and tinnitus.   Eyes: Negative for visual disturbance.  Respiratory: Negative for shortness of breath.   Cardiovascular: Negative for chest pain.  Gastrointestinal: Negative for abdominal pain and blood in stool.  Endocrine: Negative for polyuria.  Musculoskeletal: Negative for arthralgias and back pain.  Skin: Negative for rash and wound.  Allergic/Immunologic: Negative for immunocompromised state.  Hematological: Negative for adenopathy. Does not bruise/bleed easily.  Psychiatric/Behavioral: Negative for dysphoric mood and suicidal ideas.    Objective:  BP 133/86 (BP Location: Left Arm, Patient Position: Sitting, Cuff Size: Small)   Pulse 72   Temp 97.7 F (36.5 C) (Oral)   Ht '5\' 6"'  (1.676 m)   Wt 181 lb (82.1 kg)   SpO2 99%   BMI 29.21 kg/m   BP/Weight 05/28/2016 10/20/2015 10/20/3265  Systolic BP 124 580 998  Diastolic BP 86 91 77  Wt. (Lbs) 181 181 185  BMI 29.21 29.23 30.79   Physical Exam  Constitutional: She is oriented to person, place, and time. She appears well-developed and well-nourished. No distress.  Eyes: Conjunctivae and EOM are normal. Pupils are equal, round, and reactive to light.  Cardiovascular: Normal rate,  regular rhythm, normal heart sounds and intact distal pulses.   Pulmonary/Chest: Effort normal and breath sounds normal.  Musculoskeletal: She exhibits no edema or tenderness.  Neurological: She is alert and oriented to person, place, and time.  Skin: Skin is warm and dry.  Psychiatric: She has a normal mood and affect.   Lab Results  Component Value Date   HGBA1C 6.5 10/20/2015   Lab Results  Component Value Date   HGBA1C 7.0 05/28/2016   CBG 112   Assessment & Plan:   Isabella Joyce was seen today for hypertension and diabetes.  Diagnoses and all orders for this  visit:  Type 2 diabetes mellitus without complication, without long-term current use of insulin (HCC) -     Glucose (CBG) -     HgB A1c -     Discontinue: metFORMIN (GLUCOPHAGE-XR) 500 MG 24 hr tablet; Take by mouth  2 tablets after breakfast and 3 tablets after dinner -     metFORMIN (GLUCOPHAGE-XR) 500 MG 24 hr tablet; Take by mouth 1 tablets after breakfast and 2 tablets after dinner -     atorvastatin (LIPITOR) 40 MG tablet; Take 1 tablet (40 mg total) by mouth daily at 6 PM.  Essential hypertension -     diltiazem (CARDIZEM CD) 180 MG 24 hr capsule; Take 1 capsule (180 mg total) by mouth daily. -     lisinopril (PRINIVIL,ZESTRIL) 40 MG tablet; Take 1 tablet (40 mg total) by mouth daily.  Needs flu shot -     Flu Vaccine QUAD 36+ mos IM    No orders of the defined types were placed in this encounter.   Follow-up: Return in about 4 weeks (around 06/25/2016).   Boykin Nearing MD

## 2016-05-28 NOTE — Patient Instructions (Addendum)
Isabella BertholdLeticia was seen today for hypertension and diabetes.  Diagnoses and all orders for this visit:  Type 2 diabetes mellitus without complication, without long-term current use of insulin (HCC) -     Glucose (CBG) -     HgB A1c -     metFORMIN (GLUCOPHAGE-XR) 500 MG 24 hr tablet; Take by mouth 1 tablets after breakfast and 2 tablets after dinner -     atorvastatin (LIPITOR) 40 MG tablet; Take 1 tablet (40 mg total) by mouth daily at 6 PM.  Essential hypertension -     diltiazem (CARDIZEM CD) 180 MG 24 hr capsule; Take 1 capsule (180 mg total) by mouth daily. -     lisinopril (PRINIVIL,ZESTRIL) 40 MG tablet; Take 1 tablet (40 mg total) by mouth daily.  Needs flu shot -     Flu Vaccine QUAD 36+ mos IM   Diabetes blood sugar goals  Fasting (in AM before breakfast, 8 hrs of no eating or drinking (except water or unsweetened coffee or tea): 90-130 2 hrs after meals: < 160,   No low sugars: nothing < 70   F/u in 3-4 weeks for pap smear   Dr. Armen PickupFunches

## 2016-06-10 MED FILL — CARTIA XT 180 MG CAPSULE SA: 180 | 30 days supply | Qty: 30 | Fill #7

## 2016-06-10 MED FILL — METFORMIN HCL ER 500 MG TAB: 500 | 30 days supply | Qty: 60 | Fill #7

## 2016-06-10 MED FILL — LISINOPRIL 40 MG TABLET: 40 | 30 days supply | Qty: 30 | Fill #1

## 2016-06-10 MED FILL — ?ATORVASTATIN 40MG TABLET: 40 | 30 days supply | Qty: 30 | Fill #7

## 2016-07-08 MED FILL — CARTIA XT 180 MG CAPSULE SA: 180 | 30 days supply | Qty: 30 | Fill #8

## 2016-07-08 MED FILL — ATORVASTATIN 40 MG TABLET: 40 | 30 days supply | Qty: 30 | Fill #8

## 2016-07-08 MED FILL — LISINOPRIL 40 MG TABLET: 40 | 30 days supply | Qty: 30 | Fill #2

## 2016-07-08 MED FILL — METFORMIN HCL ER 500 MG TAB: 500 | 30 days supply | Qty: 60 | Fill #8

## 2016-08-09 MED FILL — ATORVASTATIN 40 MG TABLET: 40 | 30 days supply | Qty: 30 | Fill #9

## 2016-08-09 MED FILL — CARTIA XT 180 MG CAPSULE SA: 180 | 30 days supply | Qty: 30 | Fill #9

## 2016-08-09 MED FILL — LISINOPRIL 40 MG TABLET: 40 | 30 days supply | Qty: 30 | Fill #0

## 2016-08-09 MED FILL — METFORMIN HCL ER 500 MG TAB: 500 | 30 days supply | Qty: 60 | Fill #9

## 2016-09-07 ENCOUNTER — Other Ambulatory Visit: Payer: Self-pay | Admitting: Family Medicine

## 2016-09-07 MED FILL — ATORVASTATIN 40 MG TABLET: 40 | 30 days supply | Qty: 30 | Fill #10

## 2016-09-07 MED FILL — CARTIA XT 180 MG CAPSULE SA: 180 | 30 days supply | Qty: 30 | Fill #10

## 2016-09-07 MED FILL — TRUE METRIX TEST STRIP: 30 days supply | Qty: 100 | Fill #0

## 2016-09-07 MED FILL — LISINOPRIL 40 MG TABLET: 40 | 30 days supply | Qty: 30 | Fill #1

## 2016-09-07 MED FILL — METFORMIN HCL ER 500 MG TAB: 500 | 30 days supply | Qty: 60 | Fill #10

## 2016-10-05 MED FILL — METFORMIN HCL ER 500 MG TAB: 500 | 30 days supply | Qty: 60 | Fill #11

## 2016-10-05 MED FILL — ?ATORVASTATIN 40MG TABLET: 40 | 30 days supply | Qty: 30 | Fill #11

## 2016-10-05 MED FILL — LISINOPRIL 40 MG TABLET: 40 | 30 days supply | Qty: 30 | Fill #2

## 2016-10-05 MED FILL — CARTIA XT 180 MG CAPSULE SA: 180 | 30 days supply | Qty: 30 | Fill #11

## 2016-10-27 ENCOUNTER — Encounter: Payer: Self-pay | Admitting: Family Medicine

## 2016-11-03 ENCOUNTER — Other Ambulatory Visit: Payer: Self-pay | Admitting: Family Medicine

## 2016-11-03 DIAGNOSIS — I10 Essential (primary) hypertension: Secondary | ICD-10-CM

## 2016-11-03 DIAGNOSIS — E119 Type 2 diabetes mellitus without complications: Secondary | ICD-10-CM

## 2016-11-03 MED FILL — LISINOPRIL 40 MG TABLET: 40 | 30 days supply | Qty: 30 | Fill #3

## 2016-11-15 ENCOUNTER — Ambulatory Visit: Payer: Self-pay | Attending: Family Medicine

## 2016-11-15 MED FILL — CARTIA XT 180 MG CAPSULE SA: 180 | 30 days supply | Qty: 30 | Fill #0

## 2016-11-18 ENCOUNTER — Ambulatory Visit: Payer: Self-pay | Attending: Family Medicine | Admitting: Family Medicine

## 2016-11-18 ENCOUNTER — Encounter: Payer: Self-pay | Admitting: Family Medicine

## 2016-11-18 VITALS — BP 109/65 | HR 74 | Temp 98.0°F | Wt 181.4 lb

## 2016-11-18 DIAGNOSIS — E119 Type 2 diabetes mellitus without complications: Secondary | ICD-10-CM | POA: Insufficient documentation

## 2016-11-18 DIAGNOSIS — Z01419 Encounter for gynecological examination (general) (routine) without abnormal findings: Secondary | ICD-10-CM | POA: Insufficient documentation

## 2016-11-18 DIAGNOSIS — I10 Essential (primary) hypertension: Secondary | ICD-10-CM | POA: Insufficient documentation

## 2016-11-18 DIAGNOSIS — Z7984 Long term (current) use of oral hypoglycemic drugs: Secondary | ICD-10-CM | POA: Insufficient documentation

## 2016-11-18 DIAGNOSIS — Z124 Encounter for screening for malignant neoplasm of cervix: Secondary | ICD-10-CM

## 2016-11-18 DIAGNOSIS — Z79899 Other long term (current) drug therapy: Secondary | ICD-10-CM | POA: Insufficient documentation

## 2016-11-18 LAB — POCT GLYCOSYLATED HEMOGLOBIN (HGB A1C): HEMOGLOBIN A1C: 6.9

## 2016-11-18 LAB — GLUCOSE, POCT (MANUAL RESULT ENTRY): POC GLUCOSE: 118 mg/dL — AB (ref 70–99)

## 2016-11-18 MED ORDER — ATORVASTATIN CALCIUM 40 MG PO TABS: 40.0000 mg | ORAL_TABLET | Freq: Every day | ORAL | 11 refills | Status: DC

## 2016-11-18 MED ORDER — DILTIAZEM HCL ER COATED BEADS 180 MG PO CP24: 180.0000 mg | ORAL_CAPSULE | Freq: Every day | ORAL | 11 refills | Status: DC

## 2016-11-18 MED ORDER — METFORMIN HCL ER 500 MG PO TB24: ORAL_TABLET | ORAL | 11 refills | Status: DC

## 2016-11-18 MED ORDER — LISINOPRIL 40 MG PO TABS: 40.0000 mg | ORAL_TABLET | Freq: Every day | ORAL | 11 refills | Status: DC

## 2016-11-18 MED FILL — METFORMIN HCL ER 500 MG TAB: 500 | 20 days supply | Qty: 60 | Fill #0

## 2016-11-18 MED FILL — ?ATORVASTATIN 40 MG TABLET: 40 MG | 30 days supply | Qty: 30 | Fill #0

## 2016-11-18 NOTE — Patient Instructions (Addendum)
Isabella Joyce was seen today for diabetes.  Diagnoses and all orders for this visit:  Type 2 diabetes mellitus without complication, without long-term current use of insulin (HCC) -     Glucose (CBG) -     POCT glycosylated hemoglobin (Hb A1C) -     Ambulatory referral to Ophthalmology -     Lipid Panel -     atorvastatin (LIPITOR) 40 MG tablet; Take 1 tablet (40 mg total) by mouth daily at 6 PM. -     metFORMIN (GLUCOPHAGE-XR) 500 MG 24 hr tablet; Take by mouth 1 tablets after breakfast and 2 tablets after dinner  Essential hypertension -     CMP14+EGFR -     lisinopril (PRINIVIL,ZESTRIL) 40 MG tablet; Take 1 tablet (40 mg total) by mouth daily. -     diltiazem (CARDIZEM CD) 180 MG 24 hr capsule; Take 1 capsule (180 mg total) by mouth daily.  Pap smear for cervical cancer screening -     Cytology - PAP [place]   Your diabetes and HTN is well controlled You will be called with pap smear results  Complete IUD scholarship application form, provide proof of income. Bring back documentation and it will be faxed. Once IUD has been obtained you will be called to schedule placement.  F/u in 6 months for HTN and diabetes  Dr. Adrian Blackwater

## 2016-11-18 NOTE — Progress Notes (Signed)
Patient ID: Isabella Joyce, female   DOB: 04-02-1971, 46 y.o.   MRN: [id number]   Subjective:  Patient ID: Isabella Joyce, female    DOB: 02/28/1971  Age: 46 y.o. MRN: [id number] Spanish interpreter used Dan Europe  ID # [id number] Loss connection, called for another interpreter, Wilhemena Durie [id number] CC: Diabetes   HPI [name] presents for diabetes f/u  1. CHRONIC DIABETES  Disease Monitoring  Blood Sugar Ranges: she checks every days range is 98-136  Polyuria: no   Visual problems: no   Medication Compliance: taking 500 mg of metformin  In  AM and 500 mg in PM  Medication Side Effects  Hypoglycemia: no  Foot exam done today She is due for diabetic eye exam.   2. CHRONIC HYPERTENSION  Disease Monitoring  Blood pressure range: not checking    Chest pain: no   Dyspnea: no   Claudication: no   Medication compliance: yes  Medication Side Effects  Lightheadedness: no   Urinary frequency: no   Edema: no     3. HM: due for pap smear. She is amenable to pap smear today.   Social History  Substance Use Topics  . Smoking status: Never Smoker  . Smokeless tobacco: Never Used  . Alcohol use No    Outpatient Medications Prior to Visit  Medication Sig Dispense Refill  . atorvastatin (LIPITOR) 40 MG tablet Take 1 tablet (40 mg total) by mouth daily at 6 PM. 30 tablet 11  . Blood Glucose Monitoring Suppl (TRUE METRIX METER) W/DEVICE KIT USE AS DIRECTED 2 TIMES DAILY 1 kit 0  . cetirizine (ZYRTEC) 10 MG tablet Take 1 tablet (10 mg total) by mouth daily. 30 tablet 11  . diltiazem (CARDIZEM CD) 180 MG 24 hr capsule Take 1 capsule (180 mg total) by mouth daily. 30 capsule 11  . glucose blood test strip USE AS DIRECTED 2 TIMES DAILY 100 each 12  . lisinopril (PRINIVIL,ZESTRIL) 40 MG tablet Take 1 tablet (40 mg total) by mouth daily. 30 tablet 11  . metFORMIN (GLUCOPHAGE-XR) 500 MG 24 hr tablet Take by mouth 1 tablets after breakfast and 2 tablets after dinner 90 tablet  5  . TRUEPLUS LANCETS 28G MISC 1 each by Does not apply route 2 (two) times daily. 100 each 12   No facility-administered medications prior to visit.     ROS Review of Systems  Constitutional: Negative for chills and fever.  HENT: Negative for ear pain, hearing loss and tinnitus.   Eyes: Negative for visual disturbance.  Respiratory: Negative for shortness of breath.   Cardiovascular: Negative for chest pain.  Gastrointestinal: Negative for abdominal pain and blood in stool.  Endocrine: Negative for polyuria.  Musculoskeletal: Negative for arthralgias and back pain.  Skin: Negative for rash and wound.  Allergic/Immunologic: Negative for immunocompromised state.  Hematological: Negative for adenopathy. Does not bruise/bleed easily.  Psychiatric/Behavioral: Negative for dysphoric mood and suicidal ideas.    Objective:  BP 109/65   Pulse 74   Temp 98 F (36.7 C) (Oral)   Wt 181 lb 6.4 oz (82.3 kg)   SpO2 99%   BMI 29.28 kg/m   BP/Weight 11/18/2016 03/54/6568 06/16/7515  Systolic BP 001 749 449  Diastolic BP 65 86 91  Wt. (Lbs) 181.4 181 181  BMI 29.28 29.21 29.23   Physical Exam  Constitutional: She is oriented to person, place, and time. She appears well-developed and well-nourished. No distress.  Eyes: Conjunctivae and EOM are normal. Pupils are equal, round,  and reactive to light.  Cardiovascular: Normal rate, regular rhythm, normal heart sounds and intact distal pulses.   Pulmonary/Chest: Effort normal and breath sounds normal.  Genitourinary: Vagina normal and uterus normal. Pelvic exam was performed with patient prone. There is no rash, tenderness or lesion on the right labia. There is no rash, tenderness or lesion on the left labia. Cervix exhibits no motion tenderness, no discharge and no friability.  Musculoskeletal: She exhibits no edema or tenderness.  Lymphadenopathy:       Right: No inguinal adenopathy present.       Left: No inguinal adenopathy present.    Neurological: She is alert and oriented to person, place, and time.  Skin: Skin is warm and dry. No rash noted.  Psychiatric: She has a normal mood and affect.   Lab Results  Component Value Date   HGBA1C 6.9 11/18/2016   CBG 118  Assessment & Plan:   [name] was seen today for diabetes.  Diagnoses and all orders for this visit:  Type 2 diabetes mellitus without complication, without long-term current use of insulin (HCC) -     Glucose (CBG) -     POCT glycosylated hemoglobin (Hb A1C) -     Ambulatory referral to Ophthalmology -     Lipid Panel -     atorvastatin (LIPITOR) 40 MG tablet; Take 1 tablet (40 mg total) by mouth daily at 6 PM. -     metFORMIN (GLUCOPHAGE-XR) 500 MG 24 hr tablet; Take by mouth 1 tablets after breakfast and 2 tablets after dinner  Essential hypertension -     CMP14+EGFR -     lisinopril (PRINIVIL,ZESTRIL) 40 MG tablet; Take 1 tablet (40 mg total) by mouth daily. -     diltiazem (CARDIZEM CD) 180 MG 24 hr capsule; Take 1 capsule (180 mg total) by mouth daily.  Pap smear for cervical cancer screening -     Cytology - PAP [facility]    No orders of the defined types were placed in this encounter.   Follow-up: Return in about 6 months (around 05/20/2017) for HTN and diabetes.   Boykin Nearing MD

## 2016-11-19 LAB — CMP14+EGFR
ALT: 19 IU/L (ref 0–32)
AST: 18 IU/L (ref 0–40)
Albumin/Globulin Ratio: 1.4 (ref 1.2–2.2)
Albumin: 4.2 g/dL (ref 3.5–5.5)
Alkaline Phosphatase: 86 IU/L (ref 39–117)
BILIRUBIN TOTAL: 0.5 mg/dL (ref 0.0–1.2)
BUN/Creatinine Ratio: 21 (ref 9–23)
BUN: 13 mg/dL (ref 6–24)
CHLORIDE: 105 mmol/L (ref 96–106)
CO2: 26 mmol/L (ref 18–29)
Calcium: 9.4 mg/dL (ref 8.7–10.2)
Creatinine, Ser: 0.61 mg/dL (ref 0.57–1.00)
GFR calc Af Amer: 127 mL/min/{1.73_m2} (ref >59)
GFR calc non Af Amer: 110 mL/min/{1.73_m2} (ref >59)
Globulin, Total: 2.9 g/dL (ref 1.5–4.5)
Glucose: 100 mg/dL — ABNORMAL HIGH (ref 65–99)
POTASSIUM: 4.2 mmol/L (ref 3.5–5.2)
SODIUM: 142 mmol/L (ref 134–144)
Total Protein: 7.1 g/dL (ref 6.0–8.5)

## 2016-11-19 LAB — LIPID PANEL
Chol/HDL Ratio: 2.6 ratio (ref 0.0–4.4)
Cholesterol, Total: 121 mg/dL (ref 100–199)
HDL: 46 mg/dL (ref >39)
LDL Calculated: 56 mg/dL (ref 0–99)
TRIGLYCERIDES: 94 mg/dL (ref 0–149)
VLDL Cholesterol Cal: 19 mg/dL (ref 5–40)

## 2016-11-22 NOTE — Assessment & Plan Note (Signed)
Well-controlled.  Continue current regimen. 

## 2016-11-23 LAB — CYTOLOGY - PAP
Chlamydia: NEGATIVE
Diagnosis: NEGATIVE
HPV (WINDOPATH): NOT DETECTED
NEISSERIA GONORRHEA: NEGATIVE
TRICH (WINDOWPATH): NEGATIVE

## 2016-12-09 MED FILL — CARTIA XT 180 MG CAPSULE SA: 180 | 30 days supply | Qty: 30 | Fill #1

## 2016-12-09 MED FILL — LISINOPRIL 40 MG TABLET: 40 | 30 days supply | Qty: 30 | Fill #4

## 2016-12-16 ENCOUNTER — Telehealth: Payer: Self-pay

## 2016-12-16 NOTE — Telephone Encounter (Signed)
Pt was called and informed of lab results.([id number]) 

## 2017-01-10 MED FILL — ATORVASTATIN 40 MG TABLET: 40 | 30 days supply | Qty: 30 | Fill #1

## 2017-01-10 MED FILL — METFORMIN HCL ER 500 MG TAB: 500 | 20 days supply | Qty: 60 | Fill #1

## 2017-01-10 MED FILL — TRUE METRIX TEST STRIP: 30 days supply | Qty: 100 | Fill #1

## 2017-01-10 MED FILL — LISINOPRIL 40 MG TABLET: 40 | 30 days supply | Qty: 30 | Fill #5

## 2017-01-17 MED FILL — CARTIA XT 180 MG CAPSULE SA: 180 | 30 days supply | Qty: 30 | Fill #2

## 2017-02-08 MED FILL — CARTIA XT 180 MG CAPSULE SA: 180 | 30 days supply | Qty: 30 | Fill #3

## 2017-02-08 MED FILL — LISINOPRIL 40 MG TABLET: 40 | 30 days supply | Qty: 30 | Fill #6

## 2017-02-08 MED FILL — ATORVASTATIN 40 MG TABLET: 40 | 30 days supply | Qty: 30 | Fill #2

## 2017-02-08 MED FILL — METFORMIN HCL ER 500 MG TAB: 500 | 20 days supply | Qty: 60 | Fill #2

## 2017-03-09 MED FILL — CARTIA XT 180 MG CAPSULE SA: 180 | 30 days supply | Qty: 30 | Fill #4

## 2017-03-09 MED FILL — METFORMIN HCL ER 500 MG TAB: 500 | 30 days supply | Qty: 90 | Fill #3

## 2017-03-09 MED FILL — LISINOPRIL 40 MG TABLET: 40 | 30 days supply | Qty: 30 | Fill #7

## 2017-03-09 MED FILL — ?ATORVASTATIN 40MG TABLET: 40 | 30 days supply | Qty: 30 | Fill #3

## 2017-04-06 MED FILL — CARTIA XT 180 MG CAPSULE SA: 180 | 30 days supply | Qty: 30 | Fill #5

## 2017-04-06 MED FILL — LISINOPRIL 40 MG TABLET: 40 | 30 days supply | Qty: 30 | Fill #8

## 2017-04-06 MED FILL — METFORMIN HCL ER 500 MG TAB: 500 | 30 days supply | Qty: 90 | Fill #4

## 2017-04-06 MED FILL — ?ATORVASTATIN 40MG TABLET: 40 | 30 days supply | Qty: 30 | Fill #4

## 2017-05-10 MED FILL — METFORMIN HCL ER 500 MG TAB: 500 | 30 days supply | Qty: 90 | Fill #5

## 2017-05-10 MED FILL — TRUE METRIX TEST STRIP: 30 days supply | Qty: 100 | Fill #2

## 2017-05-10 MED FILL — CARTIA XT 180 MG CAPSULE SA: 180 | 30 days supply | Qty: 30 | Fill #6

## 2017-05-10 MED FILL — LISINOPRIL 40 MG TAB: 40 | 30 days supply | Qty: 30 | Fill #9

## 2017-05-10 MED FILL — ?ATORVASTATIN 40MG TABLET: 40 | 30 days supply | Qty: 30 | Fill #5

## 2017-05-19 ENCOUNTER — Ambulatory Visit: Payer: Self-pay | Attending: Internal Medicine

## 2017-06-08 ENCOUNTER — Other Ambulatory Visit: Payer: Self-pay | Admitting: Internal Medicine

## 2017-06-08 DIAGNOSIS — I10 Essential (primary) hypertension: Secondary | ICD-10-CM

## 2017-06-08 MED FILL — ATORVASTATIN 40 MG TABLET: 40 | 30 days supply | Qty: 30 | Fill #6

## 2017-06-08 MED FILL — METFORMIN HCL ER 500 MG TAB: 500 | 30 days supply | Qty: 90 | Fill #6

## 2017-06-08 MED FILL — DILTIAZEM 24HR ER 180 MG CA: 180 | 30 days supply | Qty: 30 | Fill #0

## 2017-07-05 MED FILL — LISINOPRIL 40 MG TAB: 40 | 30 days supply | Qty: 30 | Fill #0

## 2017-07-05 MED FILL — DILTIAZEM 24HR ER 180 MG CA: 180 | 30 days supply | Qty: 30 | Fill #1

## 2017-07-05 MED FILL — ?ATORVASTATIN 40MG TABLET: 40 | 30 days supply | Qty: 30 | Fill #7

## 2017-07-05 MED FILL — METFORMIN HCL ER 500 MG TAB: 500 | 30 days supply | Qty: 90 | Fill #7

## 2017-08-02 ENCOUNTER — Ambulatory Visit: Payer: Self-pay | Attending: Nurse Practitioner | Admitting: Nurse Practitioner

## 2017-08-02 ENCOUNTER — Ambulatory Visit (HOSPITAL_COMMUNITY)
Admission: RE | Admit: 2017-08-02 | Discharge: 2017-08-02 | Disposition: A | Discharge status: Home / Self Care | Payer: Self-pay | Source: Ambulatory Visit | Attending: Nurse Practitioner | Admitting: Nurse Practitioner

## 2017-08-02 ENCOUNTER — Encounter (HOSPITAL_COMMUNITY): Payer: Self-pay

## 2017-08-02 ENCOUNTER — Encounter: Payer: Self-pay | Admitting: Nurse Practitioner

## 2017-08-02 ENCOUNTER — Other Ambulatory Visit: Payer: Self-pay | Admitting: Nurse Practitioner

## 2017-08-02 VITALS — BP 144/93 | HR 88 | Temp 97.9°F | Ht 66.0 in | Wt 179.4 lb

## 2017-08-02 DIAGNOSIS — Z7984 Long term (current) use of oral hypoglycemic drugs: Secondary | ICD-10-CM | POA: Insufficient documentation

## 2017-08-02 DIAGNOSIS — R51 Headache: Secondary | ICD-10-CM | POA: Insufficient documentation

## 2017-08-02 DIAGNOSIS — R200 Anesthesia of skin: Secondary | ICD-10-CM | POA: Insufficient documentation

## 2017-08-02 DIAGNOSIS — J3489 Other specified disorders of nose and nasal sinuses: Secondary | ICD-10-CM | POA: Insufficient documentation

## 2017-08-02 DIAGNOSIS — H9193 Unspecified hearing loss, bilateral: Secondary | ICD-10-CM

## 2017-08-02 DIAGNOSIS — G518 Other disorders of facial nerve: Secondary | ICD-10-CM | POA: Insufficient documentation

## 2017-08-02 DIAGNOSIS — E119 Type 2 diabetes mellitus without complications: Secondary | ICD-10-CM

## 2017-08-02 DIAGNOSIS — E1165 Type 2 diabetes mellitus with hyperglycemia: Secondary | ICD-10-CM | POA: Insufficient documentation

## 2017-08-02 DIAGNOSIS — E782 Mixed hyperlipidemia: Secondary | ICD-10-CM | POA: Insufficient documentation

## 2017-08-02 DIAGNOSIS — I10 Essential (primary) hypertension: Secondary | ICD-10-CM | POA: Insufficient documentation

## 2017-08-02 LAB — GLUCOSE, POCT (MANUAL RESULT ENTRY): POC GLUCOSE: 217 mg/dL — AB (ref 70–99)

## 2017-08-02 MED ORDER — LISINOPRIL 40 MG PO TABS: 40.0000 mg | ORAL_TABLET | Freq: Every day | ORAL | 3 refills | Status: DC

## 2017-08-02 MED ORDER — ASPIRIN EC 81 MG PO TBEC: 81.0000 mg | DELAYED_RELEASE_TABLET | Freq: Every day | ORAL | 3 refills | Status: DC

## 2017-08-02 MED ORDER — GLUCOSE BLOOD VI STRP: ORAL_STRIP | 12 refills | Status: DC

## 2017-08-02 MED ORDER — ATORVASTATIN CALCIUM 40 MG PO TABS
40.0000 mg | ORAL_TABLET | Freq: Every day | ORAL | 3 refills | Status: DC
Start: 2017-08-02 — End: 2017-12-01

## 2017-08-02 MED ORDER — METFORMIN HCL ER 500 MG PO TB24
ORAL_TABLET | ORAL | 11 refills | Status: DC
Start: 2017-08-02 — End: 2018-03-08

## 2017-08-02 MED ORDER — DILTIAZEM HCL ER COATED BEADS 180 MG PO CP24: 180.0000 mg | ORAL_CAPSULE | Freq: Every day | ORAL | 3 refills | Status: DC

## 2017-08-02 MED ORDER — TRUE METRIX METER W/DEVICE KIT: PACK | 0 refills | Status: DC

## 2017-08-02 MED ORDER — AMOXICILLIN-POT CLAVULANATE 875-125 MG PO TABS: 1.0000 | ORAL_TABLET | Freq: Two times a day (BID) | ORAL | 0 refills | Status: AC

## 2017-08-02 MED ORDER — TRUEPLUS LANCETS 28G MISC
1.0000 | Freq: Two times a day (BID) | 12 refills | Status: DC
Start: 2017-08-02 — End: 2018-09-01

## 2017-08-02 MED FILL — ?ATORVASTATIN 40MG TABLET: 40 | 30 days supply | Qty: 30 | Fill #8

## 2017-08-02 MED FILL — TRUE METRIX TEST STRIP: 30 days supply | Qty: 100 | Fill #0

## 2017-08-02 MED FILL — TRUE METRIX BLOOD GLUCOSE M: W/DEVICE | 1 days supply | Qty: 1 | Fill #0

## 2017-08-02 MED FILL — TRUEplus LANCETS 28G MISC: 50 days supply | Qty: 100 | Fill #0

## 2017-08-02 MED FILL — METFORMIN HCL ER 500 MG TAB: 500 | 30 days supply | Qty: 90 | Fill #8

## 2017-08-02 MED FILL — AMOX-CLAV 875-125 MG TABLET: 875-125 | 7 days supply | Qty: 14 | Fill #0

## 2017-08-02 MED FILL — LISINOPRIL 40 MG TAB: 40 | 30 days supply | Qty: 30 | Fill #1

## 2017-08-02 MED FILL — CARTIA XT 180 MG CAPSULE SA: 180 | 30 days supply | Qty: 30 | Fill #2

## 2017-08-02 NOTE — Progress Notes (Signed)
Assessment & Plan:  Isabella Joyce was seen today for establish care, numbness and cough.  Diagnoses and all orders for this visit:  Neuralgic facial pain -     CT Head Wo Contrast; Future -     aspirin EC 81 MG tablet; Take 1 tablet (81 mg total) by mouth daily.  Bilateral hearing loss, unspecified hearing loss type -     Ambulatory referral to ENT  Mixed hyperlipidemia -     atorvastatin (LIPITOR) 40 MG tablet; Take 1 tablet (40 mg total) by mouth daily at 6 PM. Work on a low fat, heart healthy diet and participate in regular aerobic exercise program to control as well by working out at least 150 minutes per week. No fried foods. No junk foods, sodas, sugary drinks, unhealthy snacking, or smoking.    Essential hypertension -     lisinopril (PRINIVIL,ZESTRIL) 40 MG tablet; Take 1 tablet (40 mg total) by mouth daily. -     diltiazem (CARDIZEM CD) 180 MG 24 hr capsule; Take 1 capsule (180 mg total) by mouth daily. Continue all antihypertensives as prescribed.  Remember to bring in your blood pressure log with you for your follow up appointment.  DASH/Mediterranean Diets are healthier choices for HTN.   Type 2 diabetes mellitus with hyperglycemia, without long-term current use of insulin (HCC) -     Glucose (CBG) -     CBC -     Basic metabolic panel -     Lipid panel -     Hemoglobin A1c -     Blood Glucose Monitoring Suppl (TRUE METRIX METER) w/Device KIT; USE AS DIRECTED 2 TIMES DAILY -     glucose blood test strip; Use as instructed -     metFORMIN (GLUCOPHAGE-XR) 500 MG 24 hr tablet; Take by mouth 1 tablets after breakfast and 2 tablets after dinner -     TRUEPLUS LANCETS 28G MISC; 1 each by Does not apply route 2 (two) times daily.  Diabetes Mellitus, Type 2 : Controlled Continue medications. Keep blood sugar logs as instructed.   Patient has been counseled on age-appropriate routine health concerns for screening and prevention. These are reviewed and up-to-date. Referrals  have been placed accordingly. Immunizations are up-to-date or declined.    Subjective:   Chief Complaint  Patient presents with  . Establish Care    Patient is here to establish care for diabetes.  . Numbness    Patient stated she have numbness pain on her left side of face down to her tooth since Sunday.   . Cough    Patient stated she have a dry cough and sore throat for 3 weeks.    HPI Isabella Joyce 47 y.o. female presents to office today to establish care. VRI was used to communicate directly with patient for the entire encounter including providing detailed patient instructions.   Facial Numbness Acute onset 2 days ago with left sided facial numbness along with severe facial and dental pain. She states the numbness is still present today along with mild pain in the left side of her face although both have slightly improved. She is having trouble with chewing food on the left side of her mouth. She denies any drooling or liquids spilling from the left side of her mouth when she drinks. She denies any excessive tearing of the eye.  She reports it is difficult for her to taste any foods since she has been sick 3 weeks ago with URI symptoms.  Yesterday  she had increased left sided dentall pain. She states sometime last summer she remembers biting down on a piece of ice and experiencing a sharp pain in the left side of her mouth. She denies any dental problems prior to this. She does not have a history of HSV.    Diabetes Mellitus Type 2 Chronic. She was diagnosed 10 years ago. She is checking her blood sugars at home twice a day. Averaging 110-140s. She endorses medication compliance with taking metformin TID.  She denies any hypo or hyperglycemic symptoms.  Lab Results  Component Value Date   HGBA1C 6.9 11/18/2016    Essential Hypertension Her blood pressure is elevated today. Likely due to her upper respiratory symptoms and OTC medications. She takes lisinopril and cardizem as  prescribed. Denies chest pain, shortness of breath, palpitations, lightheadedness, dizziness, headaches or BLE edema.  BP Readings from Last 3 Encounters:  08/02/17 (!) 144/93  11/18/16 109/65  05/28/16 133/86    Hyperlipidemia She takes atorvastatin at night as prescribed. Denies myalgias or statin intolerance.  Lab Results  Component Value Date   CHOL 121 11/18/2016   HDL 46 11/18/2016   LDLCALC 56 11/18/2016   TRIG 94 11/18/2016   CHOLHDL 2.6 11/18/2016    Hearing Loss She lost her hearing "many years" ago. She endorses hearing loss for over 30 years and as a child. She had hearing aids prescribed by a specialist here in the [place]. She does not wear her hearing aids often. Reports headaches, pain in ears and hearing worse when she wears the hearing aids. She states her hearing loss is worse when she comes for office appointments and she was told this could be due to anxiety. She will need to see ENT for workup.    Upper Respiratory Infection Patient complains of symptoms of a URI. Symptoms include cough, sore throat and fever with chills, headaches, runny nose and nasal congestion. Onset of symptoms was 3 weeks ago, unchanged since that time. She also c/o achiness and cough described as productive of green sputum for the past 3 week .  She has had very poor oral intake and poor appetite. Evaluation to date: none. Treatment to date: cough suppressants and decongestants. Nyquil, tylenol and other OTC cough syrups.    Review of Systems  Constitutional: Positive for chills, fever and malaise/fatigue.  HENT: Positive for congestion, ear pain, hearing loss, sinus pain and sore throat. Negative for ear discharge, nosebleeds and tinnitus.   Eyes: Negative.  Negative for pain, discharge and redness.  Respiratory: Positive for cough and sputum production. Negative for hemoptysis, shortness of breath, wheezing and stridor.   Cardiovascular: Negative.  Negative for chest pain, orthopnea and leg  swelling.  Gastrointestinal: Negative for nausea and vomiting.  Neurological: Positive for tingling, sensory change and headaches. Negative for tremors, speech change and seizures.    Past Medical History:  Diagnosis Date  . Diabetes mellitus without complication ([facility]) 6237  . Hearing deficit   . Hyperlipidemia   . Hypertension 2008    No past surgical history on file.  Family History  Problem Relation Age of Onset  . Diabetes Mother   . Hypertension Mother   . Diabetes Father   . Hypertension Father   . Cancer Neg Hx   . Heart disease Neg Hx     Social History Reviewed with no changes to be made today.   Outpatient Medications Prior to Visit  Medication Sig Dispense Refill  . atorvastatin (LIPITOR) 40 MG tablet Take  1 tablet (40 mg total) by mouth daily at 6 PM. 30 tablet 11  . Blood Glucose Monitoring Suppl (TRUE METRIX METER) W/DEVICE KIT USE AS DIRECTED 2 TIMES DAILY 1 kit 0  . diltiazem (CARDIZEM CD) 180 MG 24 hr capsule Take 1 capsule (180 mg total) by mouth daily. 30 capsule 11  . glucose blood test strip USE AS DIRECTED 2 TIMES DAILY 100 each 12  . lisinopril (PRINIVIL,ZESTRIL) 40 MG tablet Take 1 tablet (40 mg total) by mouth daily. 30 tablet 11  . metFORMIN (GLUCOPHAGE-XR) 500 MG 24 hr tablet Take by mouth 1 tablets after breakfast and 2 tablets after dinner 60 tablet 11  . TRUEPLUS LANCETS 28G MISC 1 each by Does not apply route 2 (two) times daily. 100 each 12   No facility-administered medications prior to visit.     Allergies  Allergen Reactions  . Pollen Extract     Hear swelling       Objective:    BP (!) 144/93 (BP Location: Left Arm, Patient Position: Sitting, Cuff Size: Normal)   Pulse 88   Temp 97.9 F (36.6 C) (Oral)   Ht _0  (1.676 m)   Wt 179 lb 6.4 oz (81.4 kg)   SpO2 98%   BMI 28.96 kg/m  Wt Readings from Last 3 Encounters:  08/02/17 179 lb 6.4 oz (81.4 kg)  11/18/16 181 lb 6.4 oz (82.3 kg)  05/28/16 181 lb (82.1 kg)     Physical Exam  Constitutional: She is oriented to person, place, and time. She appears well-developed and well-nourished.  HENT:  Head: Normocephalic and atraumatic.  Right Ear: Tympanic membrane, external ear and ear canal normal. Decreased hearing is noted.  Left Ear: Tympanic membrane, external ear and ear canal normal. Decreased hearing is noted.  Nose: Mucosal edema and rhinorrhea present. Right sinus exhibits no maxillary sinus tenderness and no frontal sinus tenderness. Left sinus exhibits no maxillary sinus tenderness and no frontal sinus tenderness.  Mouth/Throat: Uvula is midline and mucous membranes are normal. Posterior oropharyngeal edema and posterior oropharyngeal erythema present. No oropharyngeal exudate or tonsillar abscesses.  Eyes: EOM are normal. Pupils are equal, round, and reactive to light. Right eye exhibits no discharge. Left eye exhibits no discharge.  Neck: Normal range of motion. Neck supple. No JVD present. No thyromegaly present.  Cardiovascular: Normal rate, regular rhythm and normal heart sounds. Exam reveals no gallop and no friction rub.  No murmur heard. Pulmonary/Chest: Effort normal. No accessory muscle usage. No respiratory distress. She has no decreased breath sounds. She has no wheezes. She has rhonchi. She has no rales. She exhibits no tenderness.  Musculoskeletal: Normal range of motion. She exhibits no edema, tenderness or deformity.  Lymphadenopathy:    She has cervical adenopathy.  Neurological: She is alert and oriented to person, place, and time. She has normal strength. She displays no atrophy and no tremor. A cranial nerve deficit (cranial nerve 8) is present. No sensory deficit. She exhibits normal muscle tone. She displays a negative Romberg sign. She displays no seizure activity. Coordination and gait normal. GCS eye subscore is 4. GCS verbal subscore is 5. GCS motor subscore is 6.  There is a slight left sided facial droop present. 5th  cranial nerve intact.  Skin: Skin is warm and dry.  Psychiatric: She has a normal mood and affect. Her behavior is normal. Judgment and thought content normal.       Patient has been counseled extensively about nutrition and exercise as  well as the importance of adherence with medications and regular follow-up. The patient was given clear instructions to go to ER or return to medical center if symptoms don't improve, worsen or new problems develop. The patient verbalized understanding.   Follow-up: Return in about 3 months (around 10/30/2017) for HTN/HPL/DM.   [name], FNP-BC [facility] [place], [place]   08/02/2017, 11:41 AM

## 2017-08-02 NOTE — Progress Notes (Signed)
Patient came in person and stated she would like [facility].

## 2017-08-02 NOTE — Patient Instructions (Addendum)
Bell Palsy, Adult Bell palsy is a short-term inability to move muscles in part of the face. The inability to move (paralysis) results from inflammation or compression of the facial nerve, which travels along the skull and under the ear to the side of the face (7th cranial nerve). This nerve is responsible for facial movements that include blinking, closing the eyes, smiling, and frowning. What are the causes? The exact cause of this condition is not known. It may be caused by an infection from a virus, such as the chickenpox (herpes zoster), Epstein-Barr, or mumps virus. What increases the risk? You are more likely to develop this condition if:  You are pregnant.  You have diabetes.  You have had a recent infection in your nose, throat, or airways (upper respiratory infection).  You have a weakened body defense system (immune system).  You have had a facial injury, such as a fracture.  You have a family history of Bell palsy.  What are the signs or symptoms? Symptoms of this condition include:  Weakness on one side of the face.  Drooping eyelid and corner of the mouth.  Excessive tearing in one eye.  Difficulty closing the eyelid.  Dry eye.  Drooling.  Dry mouth.  Changes in taste.  Change in facial appearance.  Pain behind one ear.  Ringing in one or both ears.  Sensitivity to sound in one ear.  Facial twitching.  Headache.  Impaired speech.  Dizziness.  Difficulty eating or drinking.  Most of the time, only one side of the face is affected. Rarely, Bell palsy affects the whole face. How is this diagnosed? This condition is diagnosed based on:  Your symptoms.  Your medical history.  A physical exam.  You may also have to see health care providers who specialize in disorders of the nerves (neurologist) or diseases and conditions of the eye (ophthalmologist). You may have tests, such as:  A test to check for nerve damage (electromyogram).  Imaging  studies, such as CT or MRI scans.  Blood tests.  How is this treated? This condition affects every person differently. Sometimes symptoms go away without treatment within a couple weeks. If treatment is needed, it varies from person to person. The goal of treatment is to reduce inflammation and protect the eye from damage. Treatment for Bell palsy may include:  Medicines, such as: ? Steroids to reduce swelling and inflammation. ? Antiviral drugs. ? Pain relievers, including aspirin, acetaminophen, or ibuprofen.  Eye drops or ointment to keep your eye moist.  Eye protection, if you cannot close your eye.  Exercises or massage to regain muscle strength and function (physical therapy).  Follow these instructions at home:  Take over-the-counter and prescription medicines only as told by your health care provider.  If your eye is affected: ? Keep your eye moist with eye drops or ointment as told by your health care provider. ? Follow instructions for eye care and protection as told by your health care provider.  Do any physical therapy exercises as told by your health care provider.  Keep all follow-up visits as told by your health care provider. This is important. Contact a health care provider if:  You have a fever.  Your symptoms do not get better within 2-3 weeks, or your symptoms get worse.  Your eye is red, irritated, or painful.  You have new symptoms. Get help right away if:  You have weakness or numbness in a part of your body other than your face.    You have trouble swallowing.  You develop neck pain or stiffness.  You develop dizziness or shortness of breath. Summary  Bell palsy is a short-term inability to move muscles in part of the face. The inability to move (paralysis) results from inflammation or compression of the facial nerve.  This condition affects every person differently. Sometimes symptoms go away without treatment within a couple weeks.  If  treatment is needed, it varies from person to person. The goal of treatment is to reduce inflammation and protect the eye from damage.  Contact your health care provider if your symptoms do not get better within 2-3 weeks, or your symptoms get worse. This information is not intended to replace advice given to you by your health care provider. Make sure you discuss any questions you have with your health care provider. Document Released: 05/31/2005 Document Revised: 08/03/2016 Document Reviewed: 08/03/2016 Elsevier Interactive Patient Education  2018 [employer] Parlisis facial Designer, industrial/product(Bell Palsy) La parlisis facial es una afeccin en la que se paralizan los msculos de un lado de la cara. Esto a menudo provoca la cada de un lado de la cara. Es una afeccin frecuente y Games developerla mayora de las personas se recuperan por completo. FACTORES DE RIESGO Los factores de riesgo de la parlisis facial incluyen:  Psychiatristmbarazo.  Diabetes.  Una infeccin provocada por un virus, como las infecciones que causan llagas peribucales. CAUSAS  La parlisis facial es ocasionada por un dao o una inflamacin de un nervio de la cara. No est claro por qu sucede, pero puede ocurrir a causa de una infeccin provocada por un virus. La mayora de las veces se desconoce la causa. Blake DivineSIGNOS Y SNTOMAS  Los sntomas pueden variar de leves a graves y pueden durar varias horas. Entre los sntomas se pueden incluir los siguientes:  No poder Education officer, environmentalrealizar lo siguiente: ? Surveyor, miningLevantar una o las dos cejas. ? Peabody EnergyCerrar uno o los dos ojos. ? Sentir partes de la cara (entumecimiento facial).  Cada del prpado y la comisura de la boca.  Debilidad en la cara.  Parlisis de la mitad de la cara.  Prdida del gusto.  Sensibilidad a los ruidos fuertes.  Dificultad para Product managermasticar.  Lagrimeo del ojo afectado.  Sequedad del ojo afectado.  Babeo.  Dolor detrs de Fiservuna oreja. DIAGNSTICO  El diagnstico de la parlisis facial puede  incluir:  Un examen fsico y Neomia Dearuna historia clnica.  Una resonancia magntica.  Una tomografa computarizada.  Electromiograma (EMG). Esta prueba se realiza para controlar el funcionamiento de los nervios. TRATAMIENTO  El tratamiento puede incluir medicamentos antivirales para ayudar a reducir la duracin de Astronomerla afeccin. En ocasiones, el tratamiento no es necesario y los sntomas desaparecen por s solos. INSTRUCCIONES PARA EL CUIDADO EN EL HOGAR   Tome los medicamentos solamente como se lo haya indicado el mdico.  Hgase masajes y realice los ejercicios faciales, como se lo haya indicado el mdico.  Si el ojo est afectado: ? Use gotas oftlmicas con efecto hidratante para prevenir la sequedad del ojo, como se lo haya indicado el mdico. ? Proteja el ojo como se lo haya indicado el mdico. SOLICITE ATENCIN MDICA SI:  Los sntomas no mejoran o empeoran.  Babea.  El ojo est rojo, irritado o le duele. SOLICITE ATENCIN MDICA DE INMEDIATO SI:   Siente otra parte del cuerpo dbil o adormecida.  Tiene dificultad para tragar.  Tiene fiebre adems de los sntomas de la parlisis facial.  Siente dolor en el cuello. ASEGRESE DE QUE:  Comprende estas instrucciones.  Controlar su afeccin.  Recibir ayuda de inmediato si no mejora o si empeora. Esta informacin no tiene Theme park manager el consejo del mdico. Asegrese de hacerle al mdico cualquier pregunta que tenga. Document Released: 05/31/2005 Document Revised: 02/19/2015 Elsevier Interactive Patient Education  2017 [employer].

## 2017-08-03 LAB — CBC
Hematocrit: 44.6 % (ref 34.0–46.6)
Hemoglobin: 15.1 g/dL (ref 11.1–15.9)
MCH: 31.9 pg (ref 26.6–33.0)
MCHC: 33.9 g/dL (ref 31.5–35.7)
MCV: 94 fL (ref 79–97)
PLATELETS: 343 10*3/uL (ref 150–379)
RBC: 4.73 x10E6/uL (ref 3.77–5.28)
RDW: 13.4 % (ref 12.3–15.4)
WBC: 9.5 10*3/uL (ref 3.4–10.8)

## 2017-08-03 LAB — LIPID PANEL
Chol/HDL Ratio: 3.4 ratio (ref 0.0–4.4)
Cholesterol, Total: 139 mg/dL (ref 100–199)
HDL: 41 mg/dL (ref >39)
LDL Calculated: 80 mg/dL (ref 0–99)
Triglycerides: 92 mg/dL (ref 0–149)
VLDL Cholesterol Cal: 18 mg/dL (ref 5–40)

## 2017-08-03 LAB — BASIC METABOLIC PANEL
BUN/Creatinine Ratio: 14 (ref 9–23)
BUN: 8 mg/dL (ref 6–24)
CALCIUM: 9.1 mg/dL (ref 8.7–10.2)
CHLORIDE: 99 mmol/L (ref 96–106)
CO2: 24 mmol/L (ref 20–29)
Creatinine, Ser: 0.57 mg/dL (ref 0.57–1.00)
GFR calc non Af Amer: 112 mL/min/{1.73_m2} (ref >59)
GFR, EST AFRICAN AMERICAN: 129 mL/min/{1.73_m2} (ref >59)
Glucose: 194 mg/dL — ABNORMAL HIGH (ref 65–99)
POTASSIUM: 4 mmol/L (ref 3.5–5.2)
Sodium: 138 mmol/L (ref 134–144)

## 2017-08-03 LAB — HEMOGLOBIN A1C
ESTIMATED AVERAGE GLUCOSE: 180 mg/dL
HEMOGLOBIN A1C: 7.9 % — AB (ref 4.8–5.6)

## 2017-08-14 ENCOUNTER — Other Ambulatory Visit: Payer: Self-pay | Admitting: Nurse Practitioner

## 2017-08-14 MED ORDER — GLIMEPIRIDE 2 MG PO TABS: 2.0000 mg | ORAL_TABLET | Freq: Every day | ORAL | 3 refills | Status: DC

## 2017-08-16 ENCOUNTER — Telehealth: Payer: Self-pay

## 2017-08-16 NOTE — Progress Notes (Signed)
Sent a letter to patient to inform the office attempt to call patient to inform on lab results.

## 2017-08-16 NOTE — Telephone Encounter (Signed)
CMA attempt to call patient to inform on lab results.  No answer and left a voicemail for patient to call back.  Letter will be send out to patient.   If patient call back please inform:  A1c has increased from 6.9 to 7.9. Will need to add another diabetic medication. I have sent it to the pharmacy for you to take every morning along with your metformin. [facility]

## 2017-08-16 NOTE — Telephone Encounter (Signed)
-----   Message from Claiborne RiggZelda W Fleming, NP sent at 08/14/2017  8:07 PM EST ----- A1c has increased from 6.9 to 7.9. Will need to add another diabetic medication. I have sent it to the pharmacy for you to take every morning along with your metformin.

## 2017-09-05 MED FILL — ATORVASTATIN 40 MG TABLET: 40 | 30 days supply | Qty: 30 | Fill #9

## 2017-09-05 MED FILL — CARTIA XT 180 MG CAPSULE SA: 180 | 30 days supply | Qty: 30 | Fill #3

## 2017-09-05 MED FILL — METFORMIN HCL ER 500 MG TAB: 500 | 20 days supply | Qty: 60 | Fill #0

## 2017-09-05 MED FILL — LISINOPRIL 40 MG TAB: 40 | 30 days supply | Qty: 30 | Fill #2

## 2017-10-07 MED FILL — CARTIA XT 180 MG CAPSULE SA: 180 | 30 days supply | Qty: 30 | Fill #4

## 2017-10-07 MED FILL — METFORMIN HCL ER 500 MG TAB: 500 | 20 days supply | Qty: 60 | Fill #1

## 2017-10-07 MED FILL — ATORVASTATIN CALCIUM 40 MG: 40 | 30 days supply | Qty: 30 | Fill #10

## 2017-10-07 MED FILL — LISINOPRIL 40 MG TABLET: 40 | 30 days supply | Qty: 30 | Fill #3

## 2017-10-31 ENCOUNTER — Ambulatory Visit: Payer: Self-pay | Admitting: Nurse Practitioner

## 2017-11-04 MED FILL — LISINOPRIL 40 MG TABLET: 40 | 30 days supply | Qty: 30 | Fill #4

## 2017-11-04 MED FILL — METFORMIN HCL ER 500 MG TAB: 500 | 20 days supply | Qty: 60 | Fill #2

## 2017-11-04 MED FILL — CARTIA XT 180 MG CAPSULE SA: 180 | 30 days supply | Qty: 30 | Fill #5

## 2017-11-04 MED FILL — ATORVASTATIN CALCIUM 40 MG: 40 | 30 days supply | Qty: 30 | Fill #11

## 2017-11-18 ENCOUNTER — Ambulatory Visit: Payer: Self-pay | Attending: Nurse Practitioner

## 2017-11-21 ENCOUNTER — Ambulatory Visit: Payer: Self-pay | Admitting: Nurse Practitioner

## 2017-12-01 ENCOUNTER — Other Ambulatory Visit: Payer: Self-pay

## 2017-12-01 DIAGNOSIS — E782 Mixed hyperlipidemia: Secondary | ICD-10-CM

## 2017-12-01 MED ORDER — ATORVASTATIN CALCIUM 40 MG PO TABS: 40.0000 mg | ORAL_TABLET | Freq: Every day | ORAL | 3 refills | Status: DC

## 2017-12-01 MED FILL — DILTIAZEM 24HR ER 180 MG CA: 180 | 30 days supply | Qty: 30 | Fill #0

## 2017-12-01 MED FILL — ATORVASTATIN CALCIUM 40 MG: 40 | 30 days supply | Qty: 30 | Fill #0

## 2017-12-01 MED FILL — METFORMIN HCL ER 500 MG TAB: 500 | 20 days supply | Qty: 60 | Fill #3

## 2017-12-01 MED FILL — LISINOPRIL 40 MG TABLET: 40 | 30 days supply | Qty: 30 | Fill #0

## 2017-12-05 ENCOUNTER — Ambulatory Visit: Payer: Self-pay | Attending: Nurse Practitioner | Admitting: Nurse Practitioner

## 2017-12-05 ENCOUNTER — Encounter: Payer: Self-pay | Admitting: Nurse Practitioner

## 2017-12-05 VITALS — BP 123/83 | HR 70 | Temp 98.4°F | Ht 66.0 in | Wt 182.2 lb

## 2017-12-05 DIAGNOSIS — B001 Herpesviral vesicular dermatitis: Secondary | ICD-10-CM

## 2017-12-05 DIAGNOSIS — Z7982 Long term (current) use of aspirin: Secondary | ICD-10-CM | POA: Insufficient documentation

## 2017-12-05 DIAGNOSIS — Z8249 Family history of ischemic heart disease and other diseases of the circulatory system: Secondary | ICD-10-CM | POA: Insufficient documentation

## 2017-12-05 DIAGNOSIS — Z833 Family history of diabetes mellitus: Secondary | ICD-10-CM | POA: Insufficient documentation

## 2017-12-05 DIAGNOSIS — H9193 Unspecified hearing loss, bilateral: Secondary | ICD-10-CM

## 2017-12-05 DIAGNOSIS — E1165 Type 2 diabetes mellitus with hyperglycemia: Secondary | ICD-10-CM

## 2017-12-05 DIAGNOSIS — Z9109 Other allergy status, other than to drugs and biological substances: Secondary | ICD-10-CM | POA: Insufficient documentation

## 2017-12-05 DIAGNOSIS — Z09 Encounter for follow-up examination after completed treatment for conditions other than malignant neoplasm: Secondary | ICD-10-CM | POA: Insufficient documentation

## 2017-12-05 DIAGNOSIS — Z7984 Long term (current) use of oral hypoglycemic drugs: Secondary | ICD-10-CM | POA: Insufficient documentation

## 2017-12-05 DIAGNOSIS — Z79899 Other long term (current) drug therapy: Secondary | ICD-10-CM | POA: Insufficient documentation

## 2017-12-05 DIAGNOSIS — I10 Essential (primary) hypertension: Secondary | ICD-10-CM

## 2017-12-05 DIAGNOSIS — E785 Hyperlipidemia, unspecified: Secondary | ICD-10-CM | POA: Insufficient documentation

## 2017-12-05 DIAGNOSIS — Z7901 Long term (current) use of anticoagulants: Secondary | ICD-10-CM | POA: Insufficient documentation

## 2017-12-05 LAB — GLUCOSE, POCT (MANUAL RESULT ENTRY): POC Glucose: 127 mg/dl — AB (ref 70–99)

## 2017-12-05 LAB — POCT GLYCOSYLATED HEMOGLOBIN (HGB A1C): Hemoglobin A1C: 7.2 % — AB (ref 4.0–5.6)

## 2017-12-05 MED ORDER — LISINOPRIL 40 MG PO TABS: 40.0000 mg | ORAL_TABLET | Freq: Every day | ORAL | 1 refills | Status: DC

## 2017-12-05 MED ORDER — DILTIAZEM HCL ER COATED BEADS 180 MG PO CP24: 180.0000 mg | ORAL_CAPSULE | Freq: Every day | ORAL | 1 refills | Status: DC

## 2017-12-05 MED ORDER — GLIMEPIRIDE 2 MG PO TABS: 2.0000 mg | ORAL_TABLET | Freq: Every day | ORAL | 1 refills | Status: DC

## 2017-12-05 MED ORDER — VALACYCLOVIR HCL 1 G PO TABS: 2000.0000 mg | ORAL_TABLET | Freq: Two times a day (BID) | ORAL | 1 refills | Status: AC

## 2017-12-05 MED FILL — GLIMEPIRIDE 2 MG TABS: 2 | 30 days supply | Qty: 30 | Fill #0

## 2017-12-05 MED FILL — ?VALACYCLOVIR HCL 1 GRAM TA: 1 | 1 days supply | Qty: 4 | Fill #0

## 2017-12-05 NOTE — Progress Notes (Signed)
Assessment & Plan:  Isabella Joyce was seen today for follow-up.  Diagnoses and all orders for this visit:  Type 2 diabetes mellitus with hyperglycemia, without long-term current use of insulin (HCC) -     Glucose (CBG) -     HgB A1c -     glimepiride (AMARYL) 2 MG tablet; Take 1 tablet (2 mg total) by mouth daily before breakfast. Continue blood sugar control as discussed in office today, low carbohydrate diet, and regular physical exercise as tolerated, 150 minutes per week (30 min each day, 5 days per week, or 50 min 3 days per week). Keep blood sugar logs with fasting goal of 80-130 mg/dl, post prandial less than 180.  For Hypoglycemia: BS <60 and Hyperglycemia BS >400; contact the clinic ASAP. Annual eye exams and foot exams are recommended.   Essential hypertension -     lisinopril (PRINIVIL,ZESTRIL) 40 MG tablet; Take 1 tablet (40 mg total) by mouth daily. -     diltiazem (CARDIZEM CD) 180 MG 24 hr capsule; Take 1 capsule (180 mg total) by mouth daily. Continue all antihypertensives as prescribed.  Remember to bring in your blood pressure log with you for your follow up appointment.  DASH/Mediterranean Diets are healthier choices for HTN.   Cold sore New onset a few days ago. She endorses burning, tingling and pain unresponsive to Abreva -     valACYclovir (VALTREX) 1000 MG tablet; Take 2 tablets (2,000 mg total) by mouth 2 (two) times daily for 1 day.  Bilateral hearing loss, unspecified hearing loss type -     Hearing screening; Future  Hyperlipidemia INSTRUCTIONS: Work on a low fat, heart healthy diet and participate in regular aerobic exercise program by working out at least 150 minutes per week. No fried foods. No junk foods, sodas, sugary drinks, unhealthy snacking, alcohol or smoking.    Patient has been counseled on age-appropriate routine health concerns for screening and prevention. These are reviewed and up-to-date. Referrals have been placed accordingly. Immunizations  are up-to-date or declined.    Subjective:   Chief Complaint  Patient presents with  . Follow-up    Pt. is here to follow-up on hypertension, cholesterol, and diabetes.    HPI Isabella Joyce 47 y.o. female presents to office today for follow up. VRI was used to communicate directly with patient for the entire encounter including providing detailed patient instructions. She is still having difficulty hearing. I referred her to the Audiologist several months ago however she reports today she does not know how to check her voice mails on her cellphone. She can only respond to texts.   Hypertension She is not exercising and is adherent to low salt diet.  She does not have a blood pressure log  today. She is not checking her blood pressure at home. Blood pressure is well controlled here in the office today. Cardiac symptoms none. Patient denies chest pain, exertional chest pressure/discomfort, lower extremity edema, palpitations, paroxysmal nocturnal dyspnea and tachypnea.  Cardiovascular risk factors: diabetes mellitus, dyslipidemia and hypertension. Use of agents associated with hypertension: none.  History of target organ damage: none. BP Readings from Last 3 Encounters:  12/05/17 123/83  08/02/17 (!) 144/93  11/18/16 109/65    Hyperlipidemia Patient presents for follow up to hyperlipidemia.  She is medication compliant. She is diet compliant and denies skin xanthelasma or statin intolerance including myalgias.  Lab Results  Component Value Date   CHOL 139 08/02/2017   Lab Results  Component Value Date  HDL 41 08/02/2017   Lab Results  Component Value Date   LDLCALC 80 08/02/2017   Lab Results  Component Value Date   TRIG 92 08/02/2017   Lab Results  Component Value Date   CHOLHDL 3.4 08/02/2017    Diabetes Mellitus Type II Current symptoms/problems include none and have been stable.  Known diabetic complications: none Cardiovascular risk factors: diabetes  mellitus, dyslipidemia and hypertension Current diabetic medications include:  Eye exam current (within one year): no Weight trend: stable Prior visit with dietician: no Current monitoring regimen: office lab tests - 4 times yearly Home blood sugar records: fasting range: 100-120s Any episodes of hypoglycemia? no Is She on ACE inhibitor or angiotensin II receptor blocker? Yes  Lab Results  Component Value Date   HGBA1C 7.2 (A) 12/05/2017   HGBA1C 7.9 (H) 08/02/2017   HGBA1C 6.9 11/18/2016    Hearing Loss She lost her hearing "many years" ago. She endorses hearing loss for over 30 years and as a child. She had hearing aids prescribed by a specialist here in the [place]. She does not wear her hearing aids often as she reports the hearing aids cause ringing and pain. Reports headaches, pain in ears and hearing worse when she wears the hearing aids. She states her hearing loss is worse when she comes for office appointments and she was told this could be due to anxiety. She will need to see ENT for workup.   Review of Systems  Constitutional: Negative for fever, malaise/fatigue and weight loss.  HENT: Positive for hearing loss (SEE HPI). Negative for nosebleeds.   Eyes: Negative.  Negative for blurred vision, double vision and photophobia.  Respiratory: Negative.  Negative for cough and shortness of breath.   Cardiovascular: Negative.  Negative for chest pain, palpitations and leg swelling.  Gastrointestinal: Negative.  Negative for heartburn, nausea and vomiting.  Musculoskeletal: Negative.  Negative for myalgias.  Skin: Positive for itching and rash.  Neurological: Negative.  Negative for dizziness, focal weakness, seizures and headaches.  Psychiatric/Behavioral: Negative for suicidal ideas. The patient is nervous/anxious.     Past Medical History:  Diagnosis Date  . Diabetes mellitus without complication ([place]) 7544  . Hearing deficit   . Hyperlipidemia   . Hypertension 2008     History reviewed. No pertinent surgical history.  Family History  Problem Relation Age of Onset  . Diabetes Mother   . Hypertension Mother   . Diabetes Father   . Hypertension Father   . Cancer Neg Hx   . Heart disease Neg Hx     Social History Reviewed with no changes to be made today.   Outpatient Medications Prior to Visit  Medication Sig Dispense Refill  . atorvastatin (LIPITOR) 40 MG tablet Take 1 tablet (40 mg total) by mouth daily at 6 PM. 30 tablet 3  . Blood Glucose Monitoring Suppl (TRUE METRIX METER) w/Device KIT USE AS DIRECTED 2 TIMES DAILY 1 kit 0  . glucose blood test strip Use as instructed 100 each 12  . metFORMIN (GLUCOPHAGE-XR) 500 MG 24 hr tablet Take by mouth 1 tablets after breakfast and 2 tablets after dinner 60 tablet 11  . TRUEPLUS LANCETS 28G MISC 1 each by Does not apply route 2 (two) times daily. 100 each 12  . diltiazem (CARDIZEM CD) 180 MG 24 hr capsule Take 1 capsule (180 mg total) by mouth daily. 30 capsule 3  . lisinopril (PRINIVIL,ZESTRIL) 40 MG tablet Take 1 tablet (40 mg total) by mouth daily. [place]  tablet 3  . aspirin EC 81 MG tablet Take 1 tablet (81 mg total) by mouth daily. (Patient not taking: Reported on 12/05/2017) 30 tablet 3  . glimepiride (AMARYL) 2 MG tablet Take 1 tablet (2 mg total) by mouth daily before breakfast. 30 tablet 3   No facility-administered medications prior to visit.     Allergies  Allergen Reactions  . Pollen Extract     Hear swelling       Objective:    BP 123/83 (BP Location: Left Arm, Patient Position: Sitting, Cuff Size: Normal)   Pulse 70   Temp 98.4 F (36.9 C) (Oral)   Ht '5\' 6"'  (1.676 m)   Wt 182 lb 3.2 oz (82.6 kg)   LMP 06/16/2017   SpO2 96%   BMI 29.41 kg/m  Wt Readings from Last 3 Encounters:  12/05/17 182 lb 3.2 oz (82.6 kg)  08/02/17 179 lb 6.4 oz (81.4 kg)  11/18/16 181 lb 6.4 oz (82.3 kg)    Physical Exam  Constitutional: She is oriented to person, place, and time. She appears  well-developed and well-nourished. She is cooperative.  HENT:  Head: Normocephalic and atraumatic.  Right Ear: Decreased hearing is noted.  Left Ear: Decreased hearing is noted.  Mouth/Throat:    Eyes: EOM are normal.  Neck: Normal range of motion.  Cardiovascular: Normal rate, regular rhythm and normal heart sounds. Exam reveals no gallop and no friction rub.  No murmur heard. Pulmonary/Chest: Effort normal and breath sounds normal. No tachypnea. No respiratory distress. She has no decreased breath sounds. She has no wheezes. She has no rhonchi. She has no rales. She exhibits no tenderness.  Abdominal: Bowel sounds are normal.  Musculoskeletal: Normal range of motion. She exhibits no edema.  Neurological: She is alert and oriented to person, place, and time. Coordination normal.  Skin: Skin is warm and dry. Lesion (vesicular on left top lip) noted.  Psychiatric: She has a normal mood and affect. Her behavior is normal. Judgment and thought content normal.  Nursing note and vitals reviewed.      Patient has been counseled extensively about nutrition and exercise as well as the importance of adherence with medications and regular follow-up. The patient was given clear instructions to go to ER or return to medical center if symptoms don't improve, worsen or new problems develop. The patient verbalized understanding.   Follow-up: Return in about 3 months (around 03/07/2018) for Fasting labs, DM, HTN, HPL.   Some parts of today's notes were used from patient's previous visit with me on 08-02-2017. The parts that were used were pertinent to today's office visit as well.     [name], FNP-BC [facility] [place] [place]   12/05/2017, 10:52 AM

## 2017-12-05 NOTE — Patient Instructions (Addendum)
[phone]  ext  [phone] Please call the audiologist if you miss their phone call.

## 2017-12-30 MED FILL — METFORMIN HCL ER 500 MG TAB: 500 | 20 days supply | Qty: 60 | Fill #4

## 2017-12-30 MED FILL — LISINOPRIL 40 MG TABLET: 40 | 30 days supply | Qty: 30 | Fill #1

## 2017-12-30 MED FILL — DILTIAZEM 24HR ER 180 MG CA: 180 | 30 days supply | Qty: 30 | Fill #1

## 2017-12-30 MED FILL — ATORVASTATIN CALCIUM 40 MG: 40 | 30 days supply | Qty: 30 | Fill #1

## 2018-01-09 MED FILL — TRUE METRIX TEST STRIP: 30 days supply | Qty: 100 | Fill #1

## 2018-02-02 MED FILL — LISINOPRIL 40 MG TABLET: 40 | 30 days supply | Qty: 30 | Fill #2

## 2018-02-02 MED FILL — ?ATORVASTATIN 40MG TABLET: 40 | 30 days supply | Qty: 30 | Fill #2

## 2018-02-02 MED FILL — DILTIAZEM 24HR ER 180 MG CA: 180 | 30 days supply | Qty: 30 | Fill #2

## 2018-02-02 MED FILL — ?METFORMIN HCL ER 500 MG TA: 500 | 20 days supply | Qty: 60 | Fill #5

## 2018-03-03 MED FILL — LISINOPRIL 40 MG TABLET: 40 | 30 days supply | Qty: 30 | Fill #3

## 2018-03-03 MED FILL — ?ATORVASTATIN 40MG TABLET: 40 | 30 days supply | Qty: 30 | Fill #3

## 2018-03-03 MED FILL — ?METFORMIN HCL ER 500 MG TA: 500 | 20 days supply | Qty: 60 | Fill #6

## 2018-03-03 MED FILL — DILTIAZEM 24HR ER 180 MG CA: 180 | 30 days supply | Qty: 30 | Fill #3

## 2018-03-08 ENCOUNTER — Ambulatory Visit: Payer: Self-pay | Attending: Nurse Practitioner | Admitting: Nurse Practitioner

## 2018-03-08 ENCOUNTER — Encounter: Payer: Self-pay | Admitting: Nurse Practitioner

## 2018-03-08 VITALS — BP 129/86 | HR 65 | Temp 98.2°F | Ht 66.0 in | Wt 179.2 lb

## 2018-03-08 DIAGNOSIS — E1165 Type 2 diabetes mellitus with hyperglycemia: Secondary | ICD-10-CM | POA: Insufficient documentation

## 2018-03-08 DIAGNOSIS — E785 Hyperlipidemia, unspecified: Secondary | ICD-10-CM | POA: Insufficient documentation

## 2018-03-08 DIAGNOSIS — I10 Essential (primary) hypertension: Secondary | ICD-10-CM | POA: Insufficient documentation

## 2018-03-08 DIAGNOSIS — Z7982 Long term (current) use of aspirin: Secondary | ICD-10-CM | POA: Insufficient documentation

## 2018-03-08 DIAGNOSIS — Z760 Encounter for issue of repeat prescription: Secondary | ICD-10-CM | POA: Insufficient documentation

## 2018-03-08 DIAGNOSIS — Z833 Family history of diabetes mellitus: Secondary | ICD-10-CM | POA: Insufficient documentation

## 2018-03-08 DIAGNOSIS — Z09 Encounter for follow-up examination after completed treatment for conditions other than malignant neoplasm: Secondary | ICD-10-CM | POA: Insufficient documentation

## 2018-03-08 DIAGNOSIS — Z8249 Family history of ischemic heart disease and other diseases of the circulatory system: Secondary | ICD-10-CM | POA: Insufficient documentation

## 2018-03-08 DIAGNOSIS — Z7984 Long term (current) use of oral hypoglycemic drugs: Secondary | ICD-10-CM | POA: Insufficient documentation

## 2018-03-08 DIAGNOSIS — Z79899 Other long term (current) drug therapy: Secondary | ICD-10-CM | POA: Insufficient documentation

## 2018-03-08 DIAGNOSIS — E1169 Type 2 diabetes mellitus with other specified complication: Secondary | ICD-10-CM

## 2018-03-08 LAB — POCT GLYCOSYLATED HEMOGLOBIN (HGB A1C): Hemoglobin A1C: 7 % — AB (ref 4.0–5.6)

## 2018-03-08 LAB — GLUCOSE, POCT (MANUAL RESULT ENTRY): POC GLUCOSE: 135 mg/dL — AB (ref 70–99)

## 2018-03-08 MED ORDER — GLIMEPIRIDE 2 MG PO TABS: 2.0000 mg | ORAL_TABLET | Freq: Every day | ORAL | 1 refills | Status: DC

## 2018-03-08 MED ORDER — METFORMIN HCL ER 500 MG PO TB24: ORAL_TABLET | ORAL | 11 refills | Status: DC

## 2018-03-08 MED ORDER — DILTIAZEM HCL ER COATED BEADS 180 MG PO CP24: 180.0000 mg | ORAL_CAPSULE | Freq: Every day | ORAL | 1 refills | Status: DC

## 2018-03-08 MED ORDER — ATORVASTATIN CALCIUM 40 MG PO TABS: 40.0000 mg | ORAL_TABLET | Freq: Every day | ORAL | 3 refills | Status: DC

## 2018-03-08 MED ORDER — LISINOPRIL 40 MG PO TABS: 40.0000 mg | ORAL_TABLET | Freq: Every day | ORAL | 1 refills | Status: DC

## 2018-03-08 MED FILL — GLIMEPIRIDE 2 MG TABS: 2 | 30 days supply | Qty: 30 | Fill #0

## 2018-03-08 NOTE — Progress Notes (Signed)
Assessment & Plan:  Isabella Joyce was seen today for follow-up and medication refill.  Diagnoses and all orders for this visit:  Type 2 diabetes mellitus with hyperglycemia, without long-term current use of insulin (HCC) -     Glucose (CBG) -     HgB A1c -     metFORMIN (GLUCOPHAGE-XR) 500 MG 24 hr tablet; Take by mouth 1 tablets after breakfast and 2 tablets after dinner -     glimepiride (AMARYL) 2 MG tablet; Take 1 tablet (2 mg total) by mouth daily before breakfast. Continue blood sugar control as discussed in office today, low carbohydrate diet, and regular physical exercise as tolerated, 150 minutes per week (30 min each day, 5 days per week, or 50 min 3 days per week). Keep blood sugar logs with fasting goal of 90-130 mg/dl, post prandial (after you eat) less than 180.  For Hypoglycemia: BS <60 and Hyperglycemia BS >400; contact the clinic ASAP. Annual eye exams and foot exams are recommended.   Essential hypertension -     lisinopril (PRINIVIL,ZESTRIL) 40 MG tablet; Take 1 tablet (40 mg total) by mouth daily. -     diltiazem (CARDIZEM CD) 180 MG 24 hr capsule; Take 1 capsule (180 mg total) by mouth daily. Continue all antihypertensives as prescribed.  Remember to bring in your blood pressure log with you for your follow up appointment.  DASH/Mediterranean Diets are healthier choices for HTN.    Hyperlipidemia associated with type 2 diabetes mellitus (HCC) -     atorvastatin (LIPITOR) 40 MG tablet; Take 1 tablet (40 mg total) by mouth daily at 6 PM. INSTRUCTIONS: Work on a low fat, heart healthy diet and participate in regular aerobic exercise program by working out at least 150 minutes per week; 5 days a week-30 minutes per day. Avoid red meat, fried foods. junk foods, sodas, sugary drinks, unhealthy snacking, alcohol and smoking.  Drink at least 48oz of water per day and monitor your carbohydrate intake daily.      Patient has been counseled on age-appropriate routine health  concerns for screening and prevention. These are reviewed and up-to-date. Referrals have been placed accordingly. Immunizations are up-to-date or declined.    Subjective:   Chief Complaint  Patient presents with  . Follow-up    Pt. is here to follow-up on diabetes, hypertension, and cholesterol.   . Medication Refill   HPI Isabella Joyce 47 y.o. female presents to office today for for follow up. She has a PMH of DM, HTN, HPL. She is doing well today. Has no concerns or complaints.   Essential Hypertension Chronic and well controlled. Medication adherent to diltiazem 180 mg daily and lisinopril 53m daily.  She is not exercising and is adherent to low salt diet.  She does not have a blood pressure log  today. Blood pressure is well controlled at home.  Cardiac symptoms none. Patient denies chest pain, chest pressure/discomfort, exertional chest pressure/discomfort, lower extremity edema, palpitations, paroxysmal nocturnal dyspnea and tachypnea.  Cardiovascular risk factors: diabetes mellitus, dyslipidemia and hypertension. Use of agents associated with hypertension: none.  History of target organ damage: none. BP Readings from Last 3 Encounters:  03/08/18 129/86  12/05/17 123/83  08/02/17 (!) 144/93    Hyperlipidemia Patient presents for follow up to hyperlipidemia.  She is medication compliant taking atorvastatin. She is diet compliant and denies skin xanthelasma or statin intolerance including myalgias.  LDL not at goal. Fasting lipid panel ordered today.  Lab Results  Component Value Date  CHOL 139 08/02/2017   Lab Results  Component Value Date   HDL 41 08/02/2017   Lab Results  Component Value Date   LDLCALC 80 08/02/2017   Lab Results  Component Value Date   TRIG 92 08/02/2017   Lab Results  Component Value Date   CHOLHDL 3.4 08/02/2017    Diabetes Mellitus Type II Current symptoms/problems include none and have been stable.  Known diabetic complications:  none Current diabetic medications include: Metformin XR 500 mg, glimepiride 2 mg daily. 1 tablet after breakfast; 2 tablets after dinner. Eye exam current (within one year): no Weight trend: stable Prior visit with dietician: no Current monitoring regimen: office lab tests - 4 times yearly Home blood sugar records: fasting range: 100-120s Any episodes of hypoglycemia? no Is She on ACE inhibitor or angiotensin II receptor blocker?  Yes  Lab Results  Component Value Date   HGBA1C 7.0 (A) 03/08/2018   HGBA1C 7.2 (A) 12/05/2017   HGBA1C 7.9 (H) 08/02/2017   Review of Systems  Constitutional: Negative for fever, malaise/fatigue and weight loss.  HENT: Positive for hearing loss (HOH). Negative for nosebleeds.   Eyes: Negative.  Negative for blurred vision, double vision and photophobia.  Respiratory: Negative.  Negative for cough and shortness of breath.   Cardiovascular: Negative.  Negative for chest pain, palpitations and leg swelling.  Gastrointestinal: Negative.  Negative for heartburn, nausea and vomiting.  Musculoskeletal: Negative.  Negative for myalgias.  Neurological: Negative.  Negative for dizziness, focal weakness, seizures and headaches.  Psychiatric/Behavioral: Negative.  Negative for suicidal ideas.    Past Medical History:  Diagnosis Date  . Diabetes mellitus without complication ([place]) 5852  . Hearing deficit   . Hyperlipidemia   . Hypertension 2008    History reviewed. No pertinent surgical history.  Family History  Problem Relation Age of Onset  . Diabetes Mother   . Hypertension Mother   . Diabetes Father   . Hypertension Father   . Cancer Neg Hx   . Heart disease Neg Hx     Social History Reviewed with no changes to be made today.   Outpatient Medications Prior to Visit  Medication Sig Dispense Refill  . Blood Glucose Monitoring Suppl (TRUE METRIX METER) w/Device KIT USE AS DIRECTED 2 TIMES DAILY 1 kit 0  . glucose blood test strip Use as instructed  100 each 12  . TRUEPLUS LANCETS 28G MISC 1 each by Does not apply route 2 (two) times daily. 100 each 12  . atorvastatin (LIPITOR) 40 MG tablet Take 1 tablet (40 mg total) by mouth daily at 6 PM. 30 tablet 3  . lisinopril (PRINIVIL,ZESTRIL) 40 MG tablet Take 1 tablet (40 mg total) by mouth daily. 90 tablet 1  . metFORMIN (GLUCOPHAGE-XR) 500 MG 24 hr tablet Take by mouth 1 tablets after breakfast and 2 tablets after dinner 60 tablet 11  . aspirin EC 81 MG tablet Take 1 tablet (81 mg total) by mouth daily. (Patient not taking: Reported on 12/05/2017) 30 tablet 3  . diltiazem (CARDIZEM CD) 180 MG 24 hr capsule Take 1 capsule (180 mg total) by mouth daily. 90 capsule 1  . glimepiride (AMARYL) 2 MG tablet Take 1 tablet (2 mg total) by mouth daily before breakfast. 90 tablet 1   No facility-administered medications prior to visit.     Allergies  Allergen Reactions  . Pollen Extract     Hear swelling       Objective:    BP 129/86 (BP Location:  Left Arm, Patient Position: Sitting, Cuff Size: Normal)   Pulse 65   Temp 98.2 F (36.8 C) (Oral)   Ht _0  (1.676 m)   Wt 179 lb 3.2 oz (81.3 kg)   LMP 06/16/2017   SpO2 98%   BMI 28.92 kg/m  Wt Readings from Last 3 Encounters:  03/08/18 179 lb 3.2 oz (81.3 kg)  12/05/17 182 lb 3.2 oz (82.6 kg)  08/02/17 179 lb 6.4 oz (81.4 kg)    Physical Exam  Constitutional: She is oriented to person, place, and time. She appears well-developed and well-nourished. She is cooperative.  HENT:  Head: Normocephalic and atraumatic.  Eyes: EOM are normal.  Neck: Normal range of motion.  Cardiovascular: Normal rate, regular rhythm, normal heart sounds and intact distal pulses. Exam reveals no gallop and no friction rub.  No murmur heard. Pulses:      Dorsalis pedis pulses are 2+ on the right side, and 2+ on the left side.       Posterior tibial pulses are 2+ on the right side, and 2+ on the left side.  Pulmonary/Chest: Effort normal and breath sounds  normal. No tachypnea. No respiratory distress. She has no decreased breath sounds. She has no wheezes. She has no rhonchi. She has no rales. She exhibits no tenderness.  Abdominal: Soft. Bowel sounds are normal.  Musculoskeletal: Normal range of motion. She exhibits no edema.       Right foot: There is normal range of motion and no deformity.       Left foot: There is normal range of motion and no deformity.  Feet:  Right Foot:  Protective Sensation: 10 sites tested. 10 sites sensed.  Skin Integrity: Negative for skin breakdown.  Left Foot:  Protective Sensation: 10 sites tested. 10 sites sensed.  Skin Integrity: Negative for skin breakdown.  Neurological: She is alert and oriented to person, place, and time. Coordination normal.  Skin: Skin is warm and dry.  Psychiatric: She has a normal mood and affect. Her behavior is normal. Judgment and thought content normal.  Nursing note and vitals reviewed.      Patient has been counseled extensively about nutrition and exercise as well as the importance of adherence with medications and regular follow-up. The patient was given clear instructions to go to ER or return to medical center if symptoms don't improve, worsen or new problems develop. The patient verbalized understanding.   Follow-up: Return in about 3 months (around 06/07/2018) for HTN/HPL/DM.   [name], FNP-BC [facility] and [facility] [place], [place]   03/08/2018, 10:06 AM

## 2018-03-31 MED FILL — LISINOPRIL 40 MG TABLET: 40 | 30 days supply | Qty: 30 | Fill #0

## 2018-03-31 MED FILL — TRUE METRIX TEST STRIP: 30 days supply | Qty: 100 | Fill #2

## 2018-03-31 MED FILL — GLIMEPIRIDE 2 MG TABS: 2 | 30 days supply | Qty: 30 | Fill #1

## 2018-03-31 MED FILL — DILTIAZEM 24HR ER 180 MG CA: 180 | 30 days supply | Qty: 30 | Fill #0

## 2018-03-31 MED FILL — metFORMIN HCL ER 500 MG TB2: 500 | 20 days supply | Qty: 60 | Fill #7

## 2018-03-31 MED FILL — ?ATORVASTATIN 40MG TABLET: 40 | 30 days supply | Qty: 30 | Fill #0

## 2018-05-02 MED FILL — TRUE METRIX TEST STRIP: 30 days supply | Qty: 100 | Fill #3

## 2018-05-02 MED FILL — metFORMIN HCL ER 500 MG TB2: 500 | 20 days supply | Qty: 60 | Fill #8

## 2018-05-02 MED FILL — LISINOPRIL 40 MG TABLET: 40 | 30 days supply | Qty: 30 | Fill #1

## 2018-05-02 MED FILL — ?ATORVASTATIN 40MG TABLET: 40 | 30 days supply | Qty: 30 | Fill #1

## 2018-05-02 MED FILL — DILTIAZEM 24HR ER 180 MG CA: 180 | 30 days supply | Qty: 30 | Fill #1

## 2018-05-02 MED FILL — GLIMEPIRIDE 2 MG TABS: 2 | 30 days supply | Qty: 30 | Fill #2

## 2018-05-31 MED FILL — metFORMIN HCL ER 500 MG TB2: 500 | 20 days supply | Qty: 60 | Fill #9

## 2018-05-31 MED FILL — GLIMEPIRIDE 2 MG TABS: 2 | 30 days supply | Qty: 30 | Fill #3

## 2018-05-31 MED FILL — CARTIA XT 180 MG CAPSULE SA: 180 | 30 days supply | Qty: 30 | Fill #2

## 2018-05-31 MED FILL — ATORVASTATIN CALCIUM 40 MG: 40 | 30 days supply | Qty: 30 | Fill #2

## 2018-05-31 MED FILL — LISINOPRIL 40 MG TABLET: 40 | 30 days supply | Qty: 30 | Fill #2

## 2018-06-02 ENCOUNTER — Ambulatory Visit: Payer: Self-pay | Attending: Nurse Practitioner | Admitting: Nurse Practitioner

## 2018-06-02 ENCOUNTER — Encounter: Payer: Self-pay | Admitting: Nurse Practitioner

## 2018-06-02 VITALS — BP 133/89 | HR 72 | Temp 98.2°F | Resp 16 | Wt 184.0 lb

## 2018-06-02 DIAGNOSIS — I10 Essential (primary) hypertension: Secondary | ICD-10-CM | POA: Insufficient documentation

## 2018-06-02 DIAGNOSIS — Z833 Family history of diabetes mellitus: Secondary | ICD-10-CM | POA: Insufficient documentation

## 2018-06-02 DIAGNOSIS — E1165 Type 2 diabetes mellitus with hyperglycemia: Secondary | ICD-10-CM | POA: Insufficient documentation

## 2018-06-02 DIAGNOSIS — Z79899 Other long term (current) drug therapy: Secondary | ICD-10-CM | POA: Insufficient documentation

## 2018-06-02 DIAGNOSIS — Z7984 Long term (current) use of oral hypoglycemic drugs: Secondary | ICD-10-CM | POA: Insufficient documentation

## 2018-06-02 DIAGNOSIS — Z7982 Long term (current) use of aspirin: Secondary | ICD-10-CM | POA: Insufficient documentation

## 2018-06-02 DIAGNOSIS — H9193 Unspecified hearing loss, bilateral: Secondary | ICD-10-CM | POA: Insufficient documentation

## 2018-06-02 DIAGNOSIS — Z8249 Family history of ischemic heart disease and other diseases of the circulatory system: Secondary | ICD-10-CM | POA: Insufficient documentation

## 2018-06-02 DIAGNOSIS — E785 Hyperlipidemia, unspecified: Secondary | ICD-10-CM | POA: Insufficient documentation

## 2018-06-02 LAB — GLUCOSE, POCT (MANUAL RESULT ENTRY): POC GLUCOSE: 125 mg/dL — AB (ref 70–99)

## 2018-06-02 LAB — POCT GLYCOSYLATED HEMOGLOBIN (HGB A1C): HBA1C, POC (PREDIABETIC RANGE): 6.4 % (ref 5.7–6.4)

## 2018-06-02 NOTE — Progress Notes (Signed)
Assessment & Plan:  Deya was seen today for diabetes.  Diagnoses and all orders for this visit:  Type 2 diabetes mellitus with hyperglycemia, without long-term current use of insulin (HCC) -     POCT glucose (manual entry) -     POCT glycosylated hemoglobin (Hb A1C) -     Microalbumin / creatinine urine ratio Continue blood sugar control as discussed in office today, low carbohydrate diet, and regular physical exercise as tolerated, 150 minutes per week (30 min each day, 5 days per week, or 50 min 3 days per week). Keep blood sugar logs with fasting goal of 90-130 mg/dl, post prandial (after you eat) less than 180.  For Hypoglycemia: BS <60 and Hyperglycemia BS >400; contact the clinic ASAP. Annual eye exams and foot exams are recommended.   Decreased hearing loss of both ears -     Ambulatory referral to Audiology    Patient has been counseled on age-appropriate routine health concerns for screening and prevention. These are reviewed and up-to-date. Referrals have been placed accordingly. Immunizations are up-to-date or declined.    Subjective:   Chief Complaint  Patient presents with  . Diabetes   HPI Anaria Kroner 47 y.o. female presents to office today for follow up to DM, HTN and HPL. She is extremely hard of hearing. She has her [facility] [employer] card with her today with expiration date 05-23-2018. I have [facility].   Hypertension She is not exercising and is adherent to low salt diet.  She does not have a blood pressure log  Today and she does not monitor her blood pressure at home. .  Blood pressure is well controlled today.   Cardiac symptoms none. Patient denies chest pain, exertional chest pressure/discomfort, lower extremity edema, paroxysmal nocturnal dyspnea and syncope.  Cardiovascular risk factors: diabetes mellitus, dyslipidemia and hypertension. Use of agents associated with hypertension: none.  History of  target organ damage: none. BP Readings from Last 3 Encounters:  06/02/18 133/89  03/08/18 129/86  12/05/17 123/83    Hyperlipidemia Patient presents for follow up to hyperlipidemia.  She is medication compliant taking atorvastatin 40 mg daily. She is diet compliant and denies statin intolerance including myalgias. LDL is not at goal. Fasting lipid panel pending.  Lab Results  Component Value Date   CHOL 139 08/02/2017   Lab Results  Component Value Date   HDL 41 08/02/2017   Lab Results  Component Value Date   LDLCALC 80 08/02/2017   Lab Results  Component Value Date   TRIG 92 08/02/2017   Lab Results  Component Value Date   CHOLHDL 3.4 08/02/2017      Diabetes Mellitus Type II Chronic and well controlled today.  Current symptoms/problems include none and have been stable.  Known diabetic complications: cardiovascular disease Current diabetic medications include: amaryl 2 mg daily, meformin 500 mg in the am and 1000 mg in the pm.   She has her meter with her today. 7 day average 122 14 day average 118 30 day average 125 Eye exam current (within one year): no Weight trend: stable Prior visit with dietician: no Home blood sugar records: fasting range: 90-120s Any episodes of hypoglycemia? no Is She on ACE inhibitor or angiotensin II receptor blocker?  Yes  Lab Results  Component Value Date   HGBA1C 6.4 06/02/2018   HGBA1C 7.0 (A) 03/08/2018   HGBA1C 7.2 (A) 12/05/2017   Review of Systems  Constitutional: Negative  for fever, malaise/fatigue and weight loss.  HENT: Positive for hearing loss. Negative for nosebleeds.   Eyes: Negative.  Negative for blurred vision, double vision and photophobia.  Respiratory: Negative.  Negative for cough and shortness of breath.   Cardiovascular: Negative.  Negative for chest pain, palpitations and leg swelling.  Gastrointestinal: Negative.  Negative for heartburn, nausea and vomiting.  Musculoskeletal: Negative.  Negative for  myalgias.  Neurological: Negative.  Negative for dizziness, focal weakness, seizures and headaches.  Psychiatric/Behavioral: Negative.  Negative for suicidal ideas.    Past Medical History:  Diagnosis Date  . Diabetes mellitus without complication ([place]) 2774  . Hearing deficit   . Hyperlipidemia   . Hypertension 2008    No past surgical history on file.  Family History  Problem Relation Age of Onset  . Diabetes Mother   . Hypertension Mother   . Diabetes Father   . Hypertension Father   . Cancer Neg Hx   . Heart disease Neg Hx     Social History Reviewed with no changes to be made today.   Outpatient Medications Prior to Visit  Medication Sig Dispense Refill  . aspirin EC 81 MG tablet Take 1 tablet (81 mg total) by mouth daily. (Patient not taking: Reported on 12/05/2017) 30 tablet 3  . atorvastatin (LIPITOR) 40 MG tablet Take 1 tablet (40 mg total) by mouth daily at 6 PM. 90 tablet 3  . Blood Glucose Monitoring Suppl (TRUE METRIX METER) w/Device KIT USE AS DIRECTED 2 TIMES DAILY 1 kit 0  . diltiazem (CARDIZEM CD) 180 MG 24 hr capsule Take 1 capsule (180 mg total) by mouth daily. 90 capsule 1  . glimepiride (AMARYL) 2 MG tablet Take 1 tablet (2 mg total) by mouth daily before breakfast. 90 tablet 1  . glucose blood test strip Use as instructed 100 each 12  . lisinopril (PRINIVIL,ZESTRIL) 40 MG tablet Take 1 tablet (40 mg total) by mouth daily. 90 tablet 1  . metFORMIN (GLUCOPHAGE-XR) 500 MG 24 hr tablet Take by mouth 1 tablets after breakfast and 2 tablets after dinner 60 tablet 11  . TRUEPLUS LANCETS 28G MISC 1 each by Does not apply route 2 (two) times daily. 100 each 12   No facility-administered medications prior to visit.     Allergies  Allergen Reactions  . Pollen Extract     Hear swelling       Objective:    BP 133/89   Pulse 72   Temp 98.2 F (36.8 C) (Oral)   Resp 16   Wt 184 lb (83.5 kg)   LMP 06/16/2017   SpO2 98%   BMI 29.70 kg/m  Wt Readings  from Last 3 Encounters:  06/02/18 184 lb (83.5 kg)  03/08/18 179 lb 3.2 oz (81.3 kg)  12/05/17 182 lb 3.2 oz (82.6 kg)    Physical Exam Vitals signs and nursing note reviewed.  Constitutional:      Appearance: She is well-developed.  HENT:     Head: Normocephalic and atraumatic.     Right Ear: Decreased hearing noted.     Left Ear: Decreased hearing noted.  Neck:     Musculoskeletal: Normal range of motion.  Cardiovascular:     Rate and Rhythm: Normal rate and regular rhythm.     Heart sounds: Normal heart sounds. No murmur. No friction rub. No gallop.   Pulmonary:     Effort: Pulmonary effort is normal. No tachypnea or respiratory distress.     Breath sounds: Normal  breath sounds. No decreased breath sounds, wheezing, rhonchi or rales.  Chest:     Chest wall: No tenderness.  Abdominal:     General: Bowel sounds are normal.     Palpations: Abdomen is soft.  Musculoskeletal: Normal range of motion.  Skin:    General: Skin is warm and dry.  Neurological:     Mental Status: She is alert and oriented to person, place, and time.     Coordination: Coordination normal.  Psychiatric:        Behavior: Behavior normal. Behavior is cooperative.        Thought Content: Thought content normal.        Judgment: Judgment normal.          Patient has been counseled extensively about nutrition and exercise as well as the importance of adherence with medications and regular follow-up. The patient was given clear instructions to go to ER or return to medical center if symptoms don't improve, worsen or new problems develop. The patient verbalized understanding.   Follow-up: Return in about 3 months (around 09/01/2018) for DM.   [name], FNP-BC [facility] and [facility] [place], [place]   06/04/2018, 5:05 PM

## 2018-06-03 LAB — MICROALBUMIN / CREATININE URINE RATIO
CREATININE, UR: 115.1 mg/dL
Microalb/Creat Ratio: 5.3 mg/g creat (ref 0.0–30.0)
Microalbumin, Urine: 6.1 ug/mL

## 2018-06-04 ENCOUNTER — Encounter: Payer: Self-pay | Admitting: Nurse Practitioner

## 2018-07-03 MED FILL — ?METFORMIN HCL ER 500MG TAB: 500 | 20 days supply | Qty: 60 | Fill #10

## 2018-07-03 MED FILL — GLIMEPIRIDE 2 MG TABS: 2 | 30 days supply | Qty: 30 | Fill #4

## 2018-07-03 MED FILL — ?ATORVASTATIN 40MG TABLET: 40 | 30 days supply | Qty: 30 | Fill #3

## 2018-07-03 MED FILL — DILTIAZEM 24HR ER 180 MG CA: 180 | 30 days supply | Qty: 30 | Fill #3

## 2018-07-03 MED FILL — LISINOPRIL 40 MG TABLET: 40 | 30 days supply | Qty: 30 | Fill #3

## 2018-08-01 MED FILL — LISINOPRIL 40 MG TABLET: 40 | 30 days supply | Qty: 30 | Fill #4

## 2018-08-01 MED FILL — ?ATORVASTATIN 40MG TABLET: 40 | 30 days supply | Qty: 30 | Fill #4

## 2018-08-01 MED FILL — DILTIAZEM 24HR ER 180 MG CA: 180 | 30 days supply | Qty: 30 | Fill #4

## 2018-08-01 MED FILL — ?METFORMIN HCL ER 500MG TAB: 500 | 20 days supply | Qty: 60 | Fill #11

## 2018-08-01 MED FILL — GLIMEPIRIDE 2 MG TABS: 2 | 30 days supply | Qty: 30 | Fill #5

## 2018-08-11 ENCOUNTER — Ambulatory Visit: Payer: Self-pay | Attending: Nurse Practitioner

## 2018-08-16 ENCOUNTER — Ambulatory Visit: Payer: Self-pay | Attending: Nurse Practitioner | Admitting: Audiology

## 2018-08-16 ENCOUNTER — Other Ambulatory Visit: Payer: Self-pay | Admitting: Nurse Practitioner

## 2018-08-16 DIAGNOSIS — H9193 Unspecified hearing loss, bilateral: Secondary | ICD-10-CM | POA: Insufficient documentation

## 2018-08-16 DIAGNOSIS — H903 Sensorineural hearing loss, bilateral: Secondary | ICD-10-CM | POA: Insufficient documentation

## 2018-08-16 DIAGNOSIS — Z9289 Personal history of other medical treatment: Secondary | ICD-10-CM | POA: Insufficient documentation

## 2018-08-16 DIAGNOSIS — H9313 Tinnitus, bilateral: Secondary | ICD-10-CM

## 2018-08-16 NOTE — Patient Instructions (Signed)
RECOMMENDATIONS: 1.   Refer to ENT for report of ear pain, tinnitus and significant hearing loss bilaterally. Please contact Claiborne Rigg, NP to find out where she would like you to go 2.   Follow-up with [facility] since this where she obtained the hearing aids to see if adjustment (re-programming of the hearing aids)  will help her hearing.  3,   Monitor hearing closely with a repeat audiological evaluation in 6 months to rule out a progressive hearing loss - earlier if there is any change in hearing or ear pressure.  4.   To minimize the adverse effects of tinnitus 1) avoid quiet  2) use noise maskers at home such as a sound machine, quiet music, a fan or other background noise at a volume just loud enough to mask the high pitched tinnitus. 3) If the tinnitus becomes more bothersome, adversely affecting your sleep or concentration, contact your physician,  seek additional medical help by an ENT for further treatment of your tinnitus. 4.  Strategies that help improve hearing include: A) Face the speaker directly. Optimal is having the speakers face well - lit.  Unless amplified, being within 3-6 feet of the speaker will enhance word recognition. B) Avoid having the speaker back-lit as this will minimize the ability to use cues from lip-reading, facial expression and gestures. C)  Word recognition is poorer in background noise. For optimal word recognition, turn off the TV, radio or noisy fan when engaging in conversation. In a restaurant, try to sit away from noise sources and close to the primary speaker.  D)  Ask for topic clarification from time to time in order to remain in the conversation.  Most people don't mind repeating or clarifying a point when asked.  If needed, explain the difficulty hearing in background noise or hearing loss.

## 2018-08-16 NOTE — Procedures (Addendum)
Outpatient Audiology and [facility]  [address]  [place], [place] [address]  [phone]   Audiological Evaluation  Patient Name: Isabella Joyce  Status: Outpatient   DOB: [birth year]    Diagnosis: Decreased hearing       MRN: [id number] Date:  08/16/2018     Referent: Claiborne Rigg, NP  History: Unique Kaseman was seen for an audiological evaluation.She was accompanied by a Research officer, trade union,      . Lulabell states that she obtained "hearing aids from [employer] in 2015". But "now the hearing aids cause more issues now than without it"- so she "cannot use them". She has not been back for follow-up for the hearing aids.  Sharmel Beitel states that she can "hear voices, but can't understand them".   Primary Concern: Difficulty hearing, difficulty understanding with bilateral tinnitus (high pitched) and ear pain in both ears". History of hearing problems: Y - Jonikka Lascola states that she has "had ear problems since a child"; However,the hearing loss "became worse after having diabetes".  History of ear infections:  Y  History of dizziness/vertigo:   N History of balance issues:  N History of hypertension: Y History of diabetes: Y   Family history of hearing loss: Y - Father, mother and brother. The brother had hearing loss since a "young child".     Evaluation: Conventional pure tone audiometry from 250Hz  - 8000Hz  with using insert earphones.  Hearing Thresholds shows a symmetrical flat sensorineural hearing loss of 45-50 dBHL at 250Hz , 55-60dBHL from 500Hz  - 4000Hz  bilaterally with 50 dBHL on the left and 60 dBHL on the right at 6000Hz  and 60 dBHL on the left and 75 dBHL on the right at 8000Hz .  Reliability is good Speech detection thresholds using recorded speech:  Right ear: 60 dBHL.  Left ear:  60 dBHL Word recognition (most comfortable loudness volume) using monitored live voice with the Spanish Interpreter presenting the  [employer] Spanish word list at 75 dBHL, in quiet. The Spanish Interpreter sat out of view so that the words were presented auditory only. Right ear: 96%.  Left ear:   88%  Tympanometry shows normal middle ear volume, pressure and compliance (Type A) bilaterally. Ipsilateral and contralateral acoustic reflexes were completed.  Right ear: Ipsilateral acoustic reflex ranged from 95 - 100dB from 500Hz  -4000Hz .  Contralateral acoustic reflexes were no response from 1000Hz  - 4000Hz  (probe right, stimulus left) Left ear:  Ipsilateral acoustic were 95dB from 500Hz  - 4000Hz  reflex at 1000Hz . Contralateral acoustic reflexes were 100dB to no response from 1000Hz  -4000Hz  (probe left, stimulus right) -which needs monitoring.     CONCLUSION:      Marji needs further evaluation by an ENT because of the reported "ear pain and tinnitus" and to verify that the contralateral acoustic reflexes (probe right, stimulus left) continue to be abnormal/absent.  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 Close monitoring of hearing to rule out a progressive hearing loss is also recommended.   Natira had difficulty understanding recorded spanish word lists so the Spanish Interpreter accompanying her presented the words monitored live voice at very loud conversational speech level volume with excellent word recognition on the right and good word recognition on the left .  As discussed, normal volume communication will be difficult for Vannida to hear.  Denina was strongly recommended  to follow-up with [facility] regarding her hearing aids. The test results were discussed and [name] counseled.   RECOMMENDATIONS: 1.   Refer to ENT for report of ear pain, tinnitus and significant hearing loss bilaterally. 2.    Follow-up with [facility] since this where she obtained the hearing aids to see if adjustment (re-programming of the hearing aids)  will help her hearing.  3,   Monitor hearing closely with a repeat audiological evaluation in 6 months to rule out a progressive hearing loss - earlier if there is any change in hearing or ear pressure. Repeat contralateral acoustic reflexes in addition to other testing. 4.   To minimize the adverse effects of tinnitus 1) avoid quiet  2) use noise maskers at home such as a sound machine, quiet music, a fan or other background noise at a volume just loud enough to mask the high pitched tinnitus. 3) If the tinnitus becomes more bothersome, adversely affecting your sleep or concentration, contact your physician,  seek additional medical help by an ENT for further treatment of your tinnitus. 4.  Strategies that help improve hearing include: A) Face the speaker directly. Optimal is having the speakers face well - lit.  Unless amplified, being within 3-6 feet of the speaker will enhance word recognition. B) Avoid having the speaker back-lit as this will minimize the ability to use cues from lip-reading, facial expression and gestures. C)  Word recognition is poorer in background noise. For optimal word recognition, turn off the TV, radio or noisy fan when engaging in conversation. In a restaurant, try to sit away from noise sources and close to the primary speaker.  D)  Ask for topic clarification from time to time in order to remain in the conversation.  Most people don't mind repeating or clarifying a point when asked.  If needed, explain the difficulty hearing in background noise or hearing loss.       L. Kate Sable Au.D., CCC-A Doctor of Audiology 08/16/2018   cc: Claiborne Rigg, NP

## 2018-08-29 ENCOUNTER — Other Ambulatory Visit: Payer: Self-pay | Admitting: Nurse Practitioner

## 2018-08-29 DIAGNOSIS — E1165 Type 2 diabetes mellitus with hyperglycemia: Secondary | ICD-10-CM

## 2018-08-29 MED FILL — ?ATORVASTATIN 40MG TABLET: 40 | 30 days supply | Qty: 30 | Fill #5

## 2018-08-30 MED FILL — TRUE METRIX TEST STRIP: 25 days supply | Qty: 100 | Fill #0

## 2018-08-30 MED FILL — GLIMEPIRIDE 2 MG TABS: 2 | 30 days supply | Qty: 30 | Fill #0

## 2018-08-30 MED FILL — ?METFORMIN HCL ER 500 MG TA: 500 | 30 days supply | Qty: 90 | Fill #0

## 2018-09-01 ENCOUNTER — Ambulatory Visit: Payer: Self-pay | Attending: Nurse Practitioner | Admitting: Nurse Practitioner

## 2018-09-01 ENCOUNTER — Other Ambulatory Visit: Payer: Self-pay

## 2018-09-01 VITALS — BP 114/76 | HR 73 | Temp 98.7°F | Ht 66.0 in | Wt 190.0 lb

## 2018-09-01 DIAGNOSIS — J029 Acute pharyngitis, unspecified: Secondary | ICD-10-CM

## 2018-09-01 DIAGNOSIS — B349 Viral infection, unspecified: Secondary | ICD-10-CM

## 2018-09-01 DIAGNOSIS — I10 Essential (primary) hypertension: Secondary | ICD-10-CM

## 2018-09-01 DIAGNOSIS — E1165 Type 2 diabetes mellitus with hyperglycemia: Secondary | ICD-10-CM

## 2018-09-01 LAB — GLUCOSE, POCT (MANUAL RESULT ENTRY): POC GLUCOSE: 145 mg/dL — AB (ref 70–99)

## 2018-09-01 MED ORDER — TRUEPLUS LANCETS 28G MISC: 1.0000 | Freq: Two times a day (BID) | 12 refills | Status: DC

## 2018-09-01 MED ORDER — BENZOCAINE-MENTHOL 15-2.6 MG MT LOZG: LOZENGE | OROMUCOSAL | 1 refills | Status: DC

## 2018-09-01 MED ORDER — LISINOPRIL 40 MG PO TABS: 40.0000 mg | ORAL_TABLET | Freq: Every day | ORAL | 1 refills | Status: DC

## 2018-09-01 MED ORDER — DILTIAZEM HCL ER COATED BEADS 180 MG PO CP24: 180.0000 mg | ORAL_CAPSULE | Freq: Every day | ORAL | 1 refills | Status: DC

## 2018-09-01 MED FILL — DILT XR 180 MG CAPSULE: 180 | 30 days supply | Qty: 30 | Fill #0

## 2018-09-01 NOTE — Progress Notes (Signed)
Assessment & Plan:  Isabella Joyce was seen today for follow-up.  Diagnoses and all orders for this visit:  Type 2 diabetes mellitus with hyperglycemia, without long-term current use of insulin (HCC) -     Glucose (CBG) -     Hemoglobin A1c -     TRUEplus Lancets 28G MISC; 1 each by Does not apply route 2 (two) times daily. -     CBC -     CMP14+EGFR -     Lipid panel Controlled Continue medications as prescribed.  Continue blood sugar control as discussed in office today, low carbohydrate diet, and regular physical exercise as tolerated, 150 minutes per week (30 min each day, 5 days per week, or 50 min 3 days per week). Keep blood sugar logs with fasting goal of 90-130 mg/dl, post prandial (after you eat) less than 180.  For Hypoglycemia: BS <60 and Hyperglycemia BS >400; contact the clinic ASAP. Annual eye exams and foot exams are recommended.   Essential hypertension -     lisinopril (PRINIVIL,ZESTRIL) 40 MG tablet; Take 1 tablet (40 mg total) by mouth daily. -     diltiazem (CARDIZEM CD) 180 MG 24 hr capsule; Take 1 capsule (180 mg total) by mouth daily. Continue all antihypertensives as prescribed.  Remember to bring in your blood pressure log with you for your follow up appointment.  DASH/Mediterranean Diets are healthier choices for HTN.    Pharyngitis with viral syndrome -     Benzocaine-Menthol (CEPACOL EXTRA STRENGTH) 15-2.6 MG LOZG; Lozenge: Allow 1 lozenge to dissolve slowly in mouth; may repeat every 2 hours as needed.    Patient has been counseled on age-appropriate routine health concerns for screening and prevention. These are reviewed and up-to-date. Referrals have been placed accordingly. Immunizations are up-to-date or declined.    Subjective:   Chief Complaint  Patient presents with  . Follow-up    Pt is here for F/U on diabetes.    HPI [name] 48 y.o. female presents to office today for follow up of DM TYPE 2.  VRI was used to communicate  directly with patient for the entire encounter including providing detailed patient instructions. She is very HOH as well.   DM TYPE 2 Average readings 140s.  She is taking metformin XR 500 mg daily and glimepiride 2 mg daily as prescribed. She denies any hypo of hyperglycemic symptoms. Dietary and exercise modifications were discussed today.  Lab Results  Component Value Date   HGBA1C 6.9 (H) 09/01/2018   Essential Hypertension Chronic and well controlled. Taking lisinopril 40 mg daily, cardizem CD 180 mg daily as prescribed.  Denies chest pain, shortness of breath, palpitations, lightheadedness, dizziness, headaches or BLE edema.   BP Readings from Last 3 Encounters:  09/01/18 114/76  06/02/18 133/89  03/08/18 129/86   URI symptoms Symptoms started several days ago including sore throat and chest congestion with dry cough. She denies any recent travel outside of the city, sick contact exposure,  diarrhea, fever, vomiting and nausea. She is drinking plenty of fluids. Evaluation to date: none. Treatment to date: none.   Review of Systems  Constitutional: Negative for fever, malaise/fatigue and weight loss.  HENT: Positive for congestion and sore throat. Negative for ear discharge, ear pain, hearing loss, nosebleeds, sinus pain and tinnitus.   Eyes: Negative.  Negative for blurred vision, double vision and photophobia.  Respiratory: Positive for cough. Negative for sputum production, shortness of breath, wheezing and stridor.   Cardiovascular: Negative.  Negative  for chest pain, palpitations and leg swelling.  Gastrointestinal: Negative.  Negative for heartburn, nausea and vomiting.  Musculoskeletal: Negative.  Negative for myalgias.  Neurological: Negative.  Negative for dizziness, focal weakness, seizures and headaches.  Psychiatric/Behavioral: Negative.  Negative for suicidal ideas.    Past Medical History:  Diagnosis Date  . Diabetes mellitus without complication ([place]) 8657  .  Hearing deficit   . Hyperlipidemia   . Hypertension 2008    No past surgical history on file.  Family History  Problem Relation Age of Onset  . Diabetes Mother   . Hypertension Mother   . Diabetes Father   . Hypertension Father   . Cancer Neg Hx   . Heart disease Neg Hx     Social History Reviewed with no changes to be made today.   Outpatient Medications Prior to Visit  Medication Sig Dispense Refill  . atorvastatin (LIPITOR) 40 MG tablet Take 1 tablet (40 mg total) by mouth daily at 6 PM. 90 tablet 3  . Blood Glucose Monitoring Suppl (TRUE METRIX METER) w/Device KIT USE AS DIRECTED 2 TIMES DAILY 1 kit 0  . glimepiride (AMARYL) 2 MG tablet TAKE 1 TABLET (2 MG TOTAL) BY MOUTH DAILY BEFORE BREAKFAST. 30 tablet 0  . glucose blood test strip USE AS DIRECTED 100 each 12  . metFORMIN (GLUCOPHAGE-XR) 500 MG 24 hr tablet TAKE BY MOUTH 1 TABLETS AFTER BREAKFAST AND 2 TABLETS AFTER DINNER 90 tablet 0  . lisinopril (PRINIVIL,ZESTRIL) 40 MG tablet Take 1 tablet (40 mg total) by mouth daily. 90 tablet 1  . TRUEPLUS LANCETS 28G MISC 1 each by Does not apply route 2 (two) times daily. 100 each 12  . aspirin EC 81 MG tablet Take 1 tablet (81 mg total) by mouth daily. (Patient not taking: Reported on 12/05/2017) 30 tablet 3  . diltiazem (CARDIZEM CD) 180 MG 24 hr capsule Take 1 capsule (180 mg total) by mouth daily. 90 capsule 1   No facility-administered medications prior to visit.     Allergies  Allergen Reactions  . Pollen Extract     Hear swelling       Objective:    BP 114/76 (BP Location: Left Arm, Patient Position: Sitting, Cuff Size: Normal)   Pulse 73   Temp 98.7 F (37.1 C) (Oral)   Ht '5\' 6"'  (1.676 m)   Wt 190 lb (86.2 kg)   LMP 06/16/2017   SpO2 98%   BMI 30.67 kg/m  Wt Readings from Last 3 Encounters:  09/01/18 190 lb (86.2 kg)  06/02/18 184 lb (83.5 kg)  03/08/18 179 lb 3.2 oz (81.3 kg)    Physical Exam Vitals signs and nursing note reviewed.  Constitutional:       Appearance: She is well-developed.  HENT:     Head: Normocephalic and atraumatic.     Right Ear: Tympanic membrane, ear canal and external ear normal.     Left Ear: Tympanic membrane, ear canal and external ear normal.     Nose: Congestion and rhinorrhea present.     Mouth/Throat:     Mouth: Mucous membranes are moist.  Eyes:     Pupils: Pupils are equal, round, and reactive to light.  Neck:     Musculoskeletal: Normal range of motion.  Cardiovascular:     Rate and Rhythm: Normal rate and regular rhythm.     Heart sounds: Normal heart sounds. No murmur. No friction rub. No gallop.   Pulmonary:     Effort: Pulmonary effort  is normal. No tachypnea or respiratory distress.     Breath sounds: Normal breath sounds. No decreased breath sounds, wheezing, rhonchi or rales.  Chest:     Chest wall: No tenderness.  Abdominal:     General: Bowel sounds are normal.     Palpations: Abdomen is soft.  Musculoskeletal: Normal range of motion.  Skin:    General: Skin is warm and dry.  Neurological:     Mental Status: She is alert and oriented to person, place, and time.     Coordination: Coordination normal.  Psychiatric:        Behavior: Behavior normal. Behavior is cooperative.        Thought Content: Thought content normal.        Judgment: Judgment normal.          Patient has been counseled extensively about nutrition and exercise as well as the importance of adherence with medications and regular follow-up. The patient was given clear instructions to go to ER or return to medical center if symptoms don't improve, worsen or new problems develop. The patient verbalized understanding.   Follow-up: Return in about 3 months (around 12/02/2018) for DM f/u .   [name], FNP-BC [facility] [address]   09/04/2018, 2:00 PM

## 2018-09-02 LAB — CMP14+EGFR
ALK PHOS: 85 IU/L (ref 39–117)
ALT: 17 IU/L (ref 0–32)
AST: 12 IU/L (ref 0–40)
Albumin/Globulin Ratio: 1.5 (ref 1.2–2.2)
Albumin: 4 g/dL (ref 3.8–4.8)
BUN/Creatinine Ratio: 16 (ref 9–23)
BUN: 11 mg/dL (ref 6–24)
Bilirubin Total: 0.4 mg/dL (ref 0.0–1.2)
CO2: 20 mmol/L (ref 20–29)
Calcium: 8.5 mg/dL — ABNORMAL LOW (ref 8.7–10.2)
Chloride: 105 mmol/L (ref 96–106)
Creatinine, Ser: 0.7 mg/dL (ref 0.57–1.00)
GFR calc Af Amer: 119 mL/min/{1.73_m2} (ref >59)
GFR calc non Af Amer: 104 mL/min/{1.73_m2} (ref >59)
Globulin, Total: 2.6 g/dL (ref 1.5–4.5)
Glucose: 142 mg/dL — ABNORMAL HIGH (ref 65–99)
POTASSIUM: 4.5 mmol/L (ref 3.5–5.2)
Sodium: 137 mmol/L (ref 134–144)
Total Protein: 6.6 g/dL (ref 6.0–8.5)

## 2018-09-02 LAB — CBC
Hematocrit: 41.3 % (ref 34.0–46.6)
Hemoglobin: 14.2 g/dL (ref 11.1–15.9)
MCH: 32.1 pg (ref 26.6–33.0)
MCHC: 34.4 g/dL (ref 31.5–35.7)
MCV: 93 fL (ref 79–97)
Platelets: 232 10*3/uL (ref 150–450)
RBC: 4.42 x10E6/uL (ref 3.77–5.28)
RDW: 13 % (ref 11.7–15.4)
WBC: 7.9 10*3/uL (ref 3.4–10.8)

## 2018-09-02 LAB — HEMOGLOBIN A1C
Est. average glucose Bld gHb Est-mCnc: 151 mg/dL
Hgb A1c MFr Bld: 6.9 % — ABNORMAL HIGH (ref 4.8–5.6)

## 2018-09-02 LAB — LIPID PANEL
Chol/HDL Ratio: 3.1 ratio (ref 0.0–4.4)
Cholesterol, Total: 125 mg/dL (ref 100–199)
HDL: 40 mg/dL (ref >39)
LDL Calculated: 71 mg/dL (ref 0–99)
Triglycerides: 72 mg/dL (ref 0–149)
VLDL Cholesterol Cal: 14 mg/dL (ref 5–40)

## 2018-09-04 ENCOUNTER — Encounter: Payer: Self-pay | Admitting: Nurse Practitioner

## 2018-09-04 ENCOUNTER — Telehealth: Payer: Self-pay

## 2018-09-04 MED FILL — TRUEplus LANCETS 28G MISC: 50 days supply | Qty: 100 | Fill #0

## 2018-09-04 MED FILL — LISINOPRIL 40 MG TABLET: 40 | 30 days supply | Qty: 30 | Fill #0

## 2018-09-04 NOTE — Telephone Encounter (Signed)
CMA attempt to reach patient to inform on results.  No answer and left a VM.  

## 2018-09-04 NOTE — Telephone Encounter (Signed)
-----   Message from Claiborne Rigg, NP sent at 09/04/2018 10:06 AM EDT ----- A1c was 6.9. Goal is >7.0 so you are doing well with managing your diabetes. Also there is no anemia, kidney and liver function are normal as well as cholesterol levels.

## 2018-09-28 ENCOUNTER — Other Ambulatory Visit: Payer: Self-pay | Admitting: Nurse Practitioner

## 2018-09-28 MED FILL — GLIMEPIRIDE 2 MG TABS: 2 | 30 days supply | Qty: 30 | Fill #1

## 2018-09-28 MED FILL — CARTIA XT 180 MG CAPSULE SA: 180 | 30 days supply | Qty: 30 | Fill #0

## 2018-09-28 MED FILL — METFORMIN HCL ER 500 MG TAB: 500 | 20 days supply | Qty: 60 | Fill #0

## 2018-09-28 MED FILL — ?ATORVASTATIN 40MG TABLET: 40 | 30 days supply | Qty: 30 | Fill #6

## 2018-09-28 MED FILL — LISINOPRIL 40 MG TABLET: 40 | 30 days supply | Qty: 30 | Fill #5

## 2018-10-30 MED FILL — METFORMIN HCL ER 500 MG TAB: 500 | 20 days supply | Qty: 60 | Fill #1

## 2018-10-30 MED FILL — DILTIAZEM 24HR ER 180 MG CA: 180 | 30 days supply | Qty: 30 | Fill #1

## 2018-10-30 MED FILL — GLIMEPIRIDE 2 MG TABS: 2 | 30 days supply | Qty: 30 | Fill #2

## 2018-10-30 MED FILL — ?ATORVASTATIN 40MG TABLET: 40 | 30 days supply | Qty: 30 | Fill #7

## 2018-10-30 MED FILL — LISINOPRIL 40 MG TABLET: 40 | 30 days supply | Qty: 30 | Fill #1

## 2018-11-27 MED FILL — DILTIAZEM 24HR ER 180 MG CA: 180 | 30 days supply | Qty: 30 | Fill #2

## 2018-11-27 MED FILL — GLIMEPIRIDE 2 MG TABS: 2 | 30 days supply | Qty: 30 | Fill #3

## 2018-11-27 MED FILL — METFORMIN HCL ER 500 MG TAB: 500 | 30 days supply | Qty: 90 | Fill #2

## 2018-11-27 MED FILL — LISINOPRIL 40 MG TABLET: 40 | 30 days supply | Qty: 30 | Fill #2

## 2018-11-27 MED FILL — ATORVASTATIN 40 MG TABLET: 40 | 30 days supply | Qty: 30 | Fill #8

## 2018-12-08 ENCOUNTER — Ambulatory Visit: Payer: Self-pay | Attending: Nurse Practitioner | Admitting: Nurse Practitioner

## 2018-12-08 ENCOUNTER — Other Ambulatory Visit: Payer: Self-pay

## 2018-12-12 ENCOUNTER — Encounter: Payer: Self-pay | Admitting: Critical Care Medicine

## 2018-12-12 ENCOUNTER — Ambulatory Visit: Payer: HRSA Program | Attending: Critical Care Medicine | Admitting: Critical Care Medicine

## 2018-12-12 ENCOUNTER — Other Ambulatory Visit: Payer: Self-pay

## 2018-12-12 ENCOUNTER — Telehealth: Payer: Self-pay | Admitting: *Deleted

## 2018-12-12 DIAGNOSIS — E1165 Type 2 diabetes mellitus with hyperglycemia: Secondary | ICD-10-CM | POA: Diagnosis not present

## 2018-12-12 DIAGNOSIS — U071 COVID-19: Secondary | ICD-10-CM | POA: Diagnosis not present

## 2018-12-12 DIAGNOSIS — I10 Essential (primary) hypertension: Secondary | ICD-10-CM

## 2018-12-12 DIAGNOSIS — Z20822 Contact with and (suspected) exposure to covid-19: Secondary | ICD-10-CM

## 2018-12-12 HISTORY — DX: COVID-19: U07.1

## 2018-12-12 MED ORDER — ONDANSETRON HCL 4 MG PO TABS: 4.0000 mg | ORAL_TABLET | Freq: Three times a day (TID) | ORAL | 0 refills | Status: DC | PRN

## 2018-12-12 MED FILL — ?ONDANSETRON HCL 4MG TABLET: 4 | 6 days supply | Qty: 20 | Fill #0

## 2018-12-12 NOTE — Progress Notes (Signed)
Per pt she is having body pain, chills and stomach pain that started last Monday   Now she feels fatigue.  Blood sugar this morning is 180

## 2018-12-12 NOTE — Telephone Encounter (Signed)
Per patient, spoke with her son who interpreted for her.  All [place] appointments are full for today, offered patient an appointment at [facility] and [facility], but she prefers to be tested at [facility].  Scheduled her for tomorrow at 10:15 am at [facility].  Testing protocol reviewed.

## 2018-12-12 NOTE — Telephone Encounter (Signed)
-----   Message from Elsie Stain, MD sent at 12/12/2018  9:04 AM EDT ----- Regarding: needs COVID test ASAP Please schedule a COVID test through the drive-through testing ASAP for this patient  She is symptomatic  Date of birth 04/26/1971 and she has no insurance she is self insured   Dr. Joya Gaskins

## 2018-12-12 NOTE — Progress Notes (Signed)
Patient ID: Isabella Joyce, female   DOB: 1970/07/27, 48 y.o.   MRN: [id number] Virtual Visit via Telephone Note  I connected with Isabella Joyce on 12/12/18 at  8:30 AM EDT by telephone and verified that I am speaking with the correct person using two identifiers.   Consent:  I discussed the limitations, risks, security and privacy concerns of performing an evaluation and management service by telephone and the availability of in person appointments. I also discussed with the patient that there may be a patient responsible charge related to this service. The patient expressed understanding and agreed to proceed.  Location of patient: At home  Location of provider: In my office  Persons participating in the televisit with the patient.   There was a Spanish interpreter on the call whose name was Gregary Signs   History of Present Illness: 48 y.o.F with DM2 HTN seen by telephone visit for chills, body ache/pain  This patient has had a one-week history of increased cough shortness of breath also with exertion loss of the sense of taste and smell diarrhea and abdominal pain.  The patient's had chills and low-grade fever however she has not taken her temperature.  When she takes Tylenol she breaks out into a sweat.  She denies sore throat but has had chest and back pain.  She has achiness all over.  She has a slight headache but no dizziness.  She has nausea if she eats something.  She has significant fatigue and malaise.  She is sleepy all the time.  Note her husband and son also have been ill as well.  Her husband works in [employer] off [place].  He is not sure if he received this at work.  The husband was the first 1 in the home to be sick.  The husband son and the patient of all been sheltering in and isolating since being ill.  Her blood sugar has been elevated.  She is not able to take her metformin regularly.  Pos In BOLD Constitutional:     weight loss, night sweats,   Fevers, chills, fatigue, lassitude. HEENT:  headaches,  Difficulty swallowing,  Tooth/dental problems,  Sore throat,                No sneezing, itching, ear ache, nasal congestion, post nasal drip,   CV:  No chest pain,  Orthopnea, PND, swelling in lower extremities, anasarca, dizziness, palpitations  GI   heartburn, indigestion, abdominal pain, nausea, vomiting, diarrhea, change in bowel habits, loss of appetite  Resp:  shortness of breath with exertion  Not at rest.  No excess mucus, no productive cough,   non-productive cough,  No coughing up of blood.  No change in color of mucus.  No wheezing.  No chest wall deformity  Skin: no rash or lesions.   GU: no dysuria, change in color of urine, no urgency or frequency.  No flank pain.  MS:  No joint pain or swelling.  No decreased range of motion.  No back pain.  Psych:  No change in mood or affect. No depression or anxiety.  No memory loss.    Observations/Objective: No observations as this is a telephone visit  Assessment and Plan: #1 probable COVID-19 viral infection with respiratory and GI components in a diabetic with moderate risk of complications  Plan will be to prescribe Zofran as needed for nausea and have asked the patient to continue to shelter in an isolate as well the husband  and son need to isolate  I plan to notify the health department regarding the situation since husband works in a restaurant  A follow-up video visit will be scheduled for July 6  The patient knows to go to the emergency room if her symptoms worsen    Follow Up Instructions: Follow-up visit on July 6 and the patient knows that her nausea medicine be mailed to her home and she should go to the emergency room if her symptoms worsen   I discussed the assessment and treatment plan with the patient. The patient was provided an opportunity to ask questions and all were answered. The patient agreed with the plan and demonstrated an understanding of the  instructions.   The patient was advised to call back or seek an in-person evaluation if the symptoms worsen or if the condition fails to improve as anticipated.  I provided 25 minutes of non-face-to-face time during this encounter  including  median intraservice time , review of notes, labs, imaging, medications  and explaining diagnosis and management to the patient .    Shan LevansPatrick Eaden Hettinger, MD

## 2018-12-13 ENCOUNTER — Other Ambulatory Visit: Payer: Self-pay

## 2018-12-13 DIAGNOSIS — Z20822 Contact with and (suspected) exposure to covid-19: Secondary | ICD-10-CM

## 2018-12-17 LAB — NOVEL CORONAVIRUS, NAA: SARS-CoV-2, NAA: DETECTED — AB

## 2018-12-18 ENCOUNTER — Ambulatory Visit: Payer: Self-pay | Attending: Critical Care Medicine | Admitting: Critical Care Medicine

## 2018-12-18 ENCOUNTER — Encounter: Payer: Self-pay | Admitting: Critical Care Medicine

## 2018-12-18 ENCOUNTER — Other Ambulatory Visit: Payer: Self-pay

## 2018-12-18 DIAGNOSIS — I10 Essential (primary) hypertension: Secondary | ICD-10-CM

## 2018-12-18 DIAGNOSIS — E1165 Type 2 diabetes mellitus with hyperglycemia: Secondary | ICD-10-CM

## 2018-12-18 DIAGNOSIS — U071 COVID-19: Secondary | ICD-10-CM

## 2018-12-18 MED ORDER — METFORMIN HCL ER 500 MG PO TB24: ORAL_TABLET | ORAL | 0 refills | Status: DC

## 2018-12-18 NOTE — Progress Notes (Signed)
Patient ID: Isabella Joyce, female   DOB: 08-12-1970, 48 y.o.   MRN: [id number] Virtual Visit via Video Note  I connected with Isabella Joyce on 12/18/18 at  9:00 AM EDT by video enabled technology and verified that I am speaking with the correct person using two identifiers.   Consent:  I discussed the limitations, risks, security and privacy concerns of performing an evaluation and management service by video enabled technology and the availability of in person appointments. I also discussed with the patient that there may be a patient responsible charge related to this service. The patient expressed understanding and agreed to proceed.  Location of patient: At home  Location of provider: In my office  Persons participating in the televisit with the patient.   There was a Spanish interpreter on the call whose name was Isabella Joyce and the patient spouse was in the room with her   History of Present Illness: This is a follow-up video visit from a telephone visit a week ago.  The patient has COVID-19 infection and had symptoms as of a week ago.  She had been tested at community testing event.  Below is my note from a week ago  48 y.o.F with DM2 HTN seen by telephone visit for chills, body ache/pain  This patient has had a one-week history of increased cough shortness of breath also with exertion loss of the sense of taste and smell diarrhea and abdominal pain.  The patient's had chills and low-grade fever however she has not taken her temperature.  When she takes Tylenol she breaks out into a sweat.  She denies sore throat but has had chest and back pain.  She has achiness all over.  She has a slight headache but no dizziness.  She has nausea if she eats something.  She has significant fatigue and malaise.  She is sleepy all the time.  Note her husband and son also have been ill as well.  Her husband works in [employer] [place].  He is not sure if he received this at  work.  The husband was the first 1 in the home to be sick.  The husband son and the patient of all been sheltering in and isolating since being ill.  Her blood sugar has been elevated.  She is not able to take her metformin regularly.  Since the last visit the patient's symptoms are resolving.  She is and has no fever.  She is had 3 days in a row without fever and without using any antipyretics.  She has some mild fatigue.  She is not short of breath.  She has minimal cough.  Muscle aches are resolved.  All GI symptoms are resolved.  Her blood glucoses however still remain in the 1 8190 range.  She is on the Amaryl 2 mg daily and metformin 500 mg a.m. and 1000 mg p.m.  She does not work outside the home.  She does not have a job outside of the home.  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  She states for breakfast she eats beans tortillas and eggs she has similar food at lunch at night she will have a lot of pasta and fruit to eat.   Pos In BOLD Constitutional:     weight loss, night sweats,  Fevers, chills, fatigue, lassitude. HEENT:  headaches,  Difficulty swallowing,  Tooth/dental problems,  Sore throat,  No sneezing, itching, ear ache, nasal congestion, post nasal drip,   CV:  No chest pain,  Orthopnea, PND, swelling in lower extremities, anasarca, dizziness, palpitations  GI   heartburn, indigestion, abdominal pain, nausea, vomiting, diarrhea, change in bowel habits, loss of appetite  Resp:  shortness of breath with exertion  Not at rest.  No excess mucus, no productive cough,   non-productive cough,  No coughing up of blood.  No change in color of mucus.  No wheezing.  No chest wall deformity  Skin: no rash or lesions.   GU: no dysuria, change in color of urine, no urgency or frequency.  No flank pain.  MS:  No joint pain or swelling.  No decreased range of motion.  No back pain.  Psych:  No change in mood or affect. No depression or anxiety.  No memory  loss.    Observations/Objective: The patient appeared well on the video visit   assessment and Plan: #1  Resolved COVID-19 viral infection with respiratory and GI components in a diabetic with moderate risk of complications that is now completely resolving  The patient may now be released from isolation at this time and does not require repeat testing   #2 type 2 diabetes: We will increase metformin to thousand milligrams twice daily and continue Amaryl at 2 mg daily  I asked for this patient to reduce her carbohydrate intake with dietary instructions  follow Up Instructions: We will have the patient follow-up with her primary care provider within the next month and no additional COVID follow-ups are necessary   I discussed the assessment and treatment plan with the patient. The patient was provided an opportunity to ask questions and all were answered. The patient agreed with the plan and demonstrated an understanding of the instructions.   The patient was advised to call back or seek an in-person evaluation if the symptoms worsen or if the condition fails to improve as anticipated.  I provided 25 minutes of non-face-to-face time during this encounter  including  median intraservice time , review of notes, labs, imaging, medications  and explaining diagnosis and management to the patient .    Isabella Joyce , MD

## 2018-12-27 ENCOUNTER — Other Ambulatory Visit: Payer: Self-pay | Admitting: Nurse Practitioner

## 2018-12-27 DIAGNOSIS — E1165 Type 2 diabetes mellitus with hyperglycemia: Secondary | ICD-10-CM

## 2018-12-27 MED FILL — METFORMIN HCL ER 500 MG TB2: 500 | 30 days supply | Qty: 90 | Fill #3

## 2018-12-27 MED FILL — TRUE METRIX TEST STRIP: 25 days supply | Qty: 100 | Fill #1

## 2018-12-27 MED FILL — LISINOPRIL 40 MG TABLET: 40 | 30 days supply | Qty: 30 | Fill #3

## 2018-12-27 MED FILL — DILTIAZEM 24HR ER 180 MG CA: 180 | 30 days supply | Qty: 30 | Fill #3

## 2018-12-27 MED FILL — GLIMEPIRIDE 2 MG TABS: 2 | 30 days supply | Qty: 30 | Fill #0

## 2018-12-27 MED FILL — ?ATORVASTATIN 40MG TABLET: 40 | 30 days supply | Qty: 30 | Fill #9

## 2019-01-29 MED FILL — LISINOPRIL 40 MG TABLET: 40 | 30 days supply | Qty: 30 | Fill #4

## 2019-01-29 MED FILL — METFORMIN HCL ER 500 MG TB2: 500 | 30 days supply | Qty: 90 | Fill #4

## 2019-01-29 MED FILL — DILTIAZEM 24HR ER 180 MG CA: 180 | 30 days supply | Qty: 30 | Fill #4

## 2019-01-29 MED FILL — GLIMEPIRIDE 2 MG TABS: 2 | 30 days supply | Qty: 30 | Fill #1

## 2019-01-29 MED FILL — ATORVASTATIN CALCIUM 40 MG: 40 | 30 days supply | Qty: 30 | Fill #10

## 2019-02-26 MED FILL — METFORMIN HCL ER 500 MG TB2: 500 | 30 days supply | Qty: 90 | Fill #5

## 2019-02-26 MED FILL — ATORVASTATIN CALCIUM 40 MG: 40 | 30 days supply | Qty: 30 | Fill #11

## 2019-02-26 MED FILL — LISINOPRIL 40 MG TABLET: 40 | 30 days supply | Qty: 30 | Fill #5

## 2019-02-26 MED FILL — GLIMEPIRIDE 2 MG TABS: 2 | 30 days supply | Qty: 30 | Fill #2

## 2019-02-26 MED FILL — DILTIAZEM 24HR ER 180 MG CA: 180 | 30 days supply | Qty: 30 | Fill #5

## 2019-03-05 ENCOUNTER — Other Ambulatory Visit: Payer: Self-pay

## 2019-03-05 ENCOUNTER — Ambulatory Visit: Payer: Self-pay | Attending: Family Medicine | Admitting: Pharmacist

## 2019-03-05 DIAGNOSIS — Z23 Encounter for immunization: Secondary | ICD-10-CM

## 2019-03-05 NOTE — Progress Notes (Signed)
Patient presents for vaccination against influenza per orders of Zelda. Consent given. Counseling provided. No contraindications exists. Vaccine administered without incident.   

## 2019-03-20 ENCOUNTER — Other Ambulatory Visit: Payer: Self-pay

## 2019-03-20 ENCOUNTER — Ambulatory Visit: Payer: Self-pay | Attending: Nurse Practitioner | Admitting: Nurse Practitioner

## 2019-03-20 ENCOUNTER — Encounter: Payer: Self-pay | Admitting: Nurse Practitioner

## 2019-03-20 VITALS — BP 128/84 | HR 72 | Temp 98.5°F | Ht 66.0 in | Wt 185.2 lb

## 2019-03-20 DIAGNOSIS — I10 Essential (primary) hypertension: Secondary | ICD-10-CM

## 2019-03-20 DIAGNOSIS — E1169 Type 2 diabetes mellitus with other specified complication: Secondary | ICD-10-CM

## 2019-03-20 DIAGNOSIS — E785 Hyperlipidemia, unspecified: Secondary | ICD-10-CM

## 2019-03-20 DIAGNOSIS — E1165 Type 2 diabetes mellitus with hyperglycemia: Secondary | ICD-10-CM

## 2019-03-20 DIAGNOSIS — B351 Tinea unguium: Secondary | ICD-10-CM

## 2019-03-20 DIAGNOSIS — Z30432 Encounter for removal of intrauterine contraceptive device: Secondary | ICD-10-CM

## 2019-03-20 LAB — POCT GLYCOSYLATED HEMOGLOBIN (HGB A1C): Hemoglobin A1C: 6.3 % — AB (ref 4.0–5.6)

## 2019-03-20 LAB — GLUCOSE, POCT (MANUAL RESULT ENTRY): POC Glucose: 132 mg/dl — AB (ref 70–99)

## 2019-03-20 MED ORDER — TRUEPLUS LANCETS 28G MISC: 1.0000 | Freq: Two times a day (BID) | 12 refills | Status: DC

## 2019-03-20 MED ORDER — ATORVASTATIN CALCIUM 40 MG PO TABS: 40.0000 mg | ORAL_TABLET | Freq: Every day | ORAL | 3 refills | Status: DC

## 2019-03-20 MED ORDER — METFORMIN HCL ER 500 MG PO TB24: ORAL_TABLET | ORAL | 1 refills | Status: DC

## 2019-03-20 MED ORDER — GLUCOSE BLOOD VI STRP: ORAL_STRIP | 12 refills | Status: DC

## 2019-03-20 MED ORDER — DILTIAZEM HCL ER COATED BEADS 180 MG PO CP24: 180.0000 mg | ORAL_CAPSULE | Freq: Every day | ORAL | 1 refills | Status: DC

## 2019-03-20 MED ORDER — GLIMEPIRIDE 2 MG PO TABS: ORAL_TABLET | ORAL | 1 refills | Status: DC

## 2019-03-20 MED ORDER — LISINOPRIL 40 MG PO TABS: 40.0000 mg | ORAL_TABLET | Freq: Every day | ORAL | 1 refills | Status: DC

## 2019-03-20 MED ORDER — TERBINAFINE HCL 250 MG PO TABS: 250.0000 mg | ORAL_TABLET | Freq: Every day | ORAL | 1 refills | Status: AC

## 2019-03-20 NOTE — Progress Notes (Signed)
 Assessment & Plan:  Isabella Joyce was seen today for follow-up.  Diagnoses and all orders for this visit:  Type 2 diabetes mellitus with hyperglycemia, without long-term current use of insulin (HCC) -     Glucose (CBG) -     HgB A1c -     glimepiride (AMARYL) 2 MG tablet; TAKE 1 TABLET BY MOUTH DAILY BEFORE BREAKFAST. -     metFORMIN (GLUCOPHAGE-XR) 500 MG 24 hr tablet; Take 2 pills twice a day at meals -     glucose blood test strip; USE AS DIRECTED -     TRUEplus Lancets 28G MISC; 1 each by Does not apply route 2 (two) times daily. Continue blood sugar control as discussed in office today, low carbohydrate diet, and regular physical exercise as tolerated, 150 minutes per week (30 min each day, 5 days per week, or 50 min 3 days per week). Keep blood sugar logs with fasting goal of 90-130 mg/dl, post prandial (after you eat) less than 180.  For Hypoglycemia: BS <60 and Hyperglycemia BS >400; contact the clinic ASAP. Annual eye exams and foot exams are recommended.   Encounter for IUD removal -     Ambulatory referral to Gynecology 5 year exp. DEC 2020  Essential hypertension -     Basic Metabolic Panel -     lisinopril (ZESTRIL) 40 MG tablet; Take 1 tablet (40 mg total) by mouth daily. -     diltiazem (CARDIZEM CD) 180 MG 24 hr capsule; Take 1 capsule (180 mg total) by mouth daily. Continue all antihypertensives as prescribed.  Remember to bring in your blood pressure log with you for your follow up appointment.  DASH/Mediterranean Diets are healthier choices for HTN.    Hyperlipidemia associated with type 2 diabetes mellitus (HCC) -     atorvastatin (LIPITOR) 40 MG tablet; Take 1 tablet (40 mg total) by mouth daily at 6 PM. INSTRUCTIONS: Work on a low fat, heart healthy diet and participate in regular aerobic exercise program by working out at least 150 minutes per week; 5 days a week-30 minutes per day. Avoid red meat, fried foods. junk foods, sodas, sugary drinks, unhealthy snacking,  alcohol and smoking.  Drink at least 48oz of water per day and monitor your carbohydrate intake daily.    Onychomycosis of toenail -     terbinafine (LAMISIL) 250 MG tablet; Take 1 tablet (250 mg total) by mouth daily. CHECK LFT IN 6 WKS   Patient has been counseled on age-appropriate routine health concerns for screening and prevention. These are reviewed and up-to-date. Referrals have been placed accordingly. Immunizations are up-to-date or declined.    Subjective:   Chief Complaint  Patient presents with  . Follow-up    Pt. is here for a follow up diabetes. Pt. ask if PCP can refill Terbinaine HCL 250mg for her toe nail fungal.    HPI Isabella Joyce 48 y.o. female presents to office today for follow up to DM, HTN, HPL.  DM TYPE 2 Fasting averages: 98-147. Well controlled. She denies any hypo or hyperglycemic symptoms.  Current medications include glimepiride 2 mg daily and metformin XR 1000 mg twice daily. Lab Results  Component Value Date   HGBA1C 6.3 (A) 03/20/2019   ESSENTIAL HYPERTENSION Well controlled with lisinopril 40 mg daily and Cardizem 180 mg daily. Denies chest pain, shortness of breath, palpitations, lightheadedness, dizziness, headaches or BLE edema.  BP Readings from Last 3 Encounters:  03/20/19 128/84  09/01/18 114/76  06/02/18 133/89      Mixed Hyperlipidemia Currently well controlled with atorvastatin 40 mg daily.  She endorses medication compliance and denies any statin intolerance or myalgias. Lab Results  Component Value Date   LDLCALC 71 09/01/2018   Review of Systems  Constitutional: Negative for fever, malaise/fatigue and weight loss.  HENT: Positive for hearing loss. Negative for nosebleeds.   Eyes: Negative.  Negative for blurred vision, double vision and photophobia.  Respiratory: Negative.  Negative for cough and shortness of breath.   Cardiovascular: Negative.  Negative for chest pain, palpitations and leg swelling.   Gastrointestinal: Negative.  Negative for heartburn, nausea and vomiting.  Musculoskeletal: Negative.  Negative for myalgias.  Neurological: Negative.  Negative for dizziness, focal weakness, seizures and headaches.  Psychiatric/Behavioral: Negative.  Negative for suicidal ideas.    Past Medical History:  Diagnosis Date  . COVID-19 virus infection 12/12/2018  . Diabetes mellitus without complication ([place]) 1740  . Hearing deficit   . Hyperlipidemia   . Hypertension 2008    History reviewed. No pertinent surgical history.  Family History  Problem Relation Age of Onset  . Diabetes Mother   . Hypertension Mother   . Diabetes Father   . Hypertension Father   . Cancer Neg Hx   . Heart disease Neg Hx     Social History Reviewed with no changes to be made today.   Outpatient Medications Prior to Visit  Medication Sig Dispense Refill  . Blood Glucose Monitoring Suppl (TRUE METRIX METER) w/Device KIT USE AS DIRECTED 2 TIMES DAILY 1 kit 0  . atorvastatin (LIPITOR) 40 MG tablet Take 1 tablet (40 mg total) by mouth daily at 6 PM. 90 tablet 3  . glimepiride (AMARYL) 2 MG tablet TAKE 1 TABLET BY MOUTH DAILY BEFORE BREAKFAST. 90 tablet 0  . glucose blood test strip USE AS DIRECTED 100 each 12  . lisinopril (PRINIVIL,ZESTRIL) 40 MG tablet Take 1 tablet (40 mg total) by mouth daily. 90 tablet 1  . metFORMIN (GLUCOPHAGE-XR) 500 MG 24 hr tablet Take 2 pills twice a day at meals 180 tablet 0  . TRUEplus Lancets 28G MISC 1 each by Does not apply route 2 (two) times daily. 100 each 12  . Benzocaine-Menthol (CEPACOL EXTRA STRENGTH) 15-2.6 MG LOZG Lozenge: Allow 1 lozenge to dissolve slowly in mouth; may repeat every 2 hours as needed. (Patient not taking: Reported on 03/20/2019) 18 lozenge 1  . diltiazem (CARDIZEM CD) 180 MG 24 hr capsule Take 1 capsule (180 mg total) by mouth daily. 90 capsule 1  . ondansetron (ZOFRAN) 4 MG tablet Take 1 tablet (4 mg total) by mouth every 8 (eight) hours as needed  for nausea or vomiting. (Patient not taking: Reported on 03/20/2019) 20 tablet 0   No facility-administered medications prior to visit.     Allergies  Allergen Reactions  . Pollen Extract     Hear swelling       Objective:    BP 128/84 (BP Location: Left Arm, Patient Position: Sitting, Cuff Size: Normal)   Pulse 72   Temp 98.5 F (36.9 C) (Oral)   Ht 5' 6" (1.676 m)   Wt 185 lb 3.2 oz (84 kg)   LMP 06/16/2017   SpO2 99%   BMI 29.89 kg/m  Wt Readings from Last 3 Encounters:  03/20/19 185 lb 3.2 oz (84 kg)  09/01/18 190 lb (86.2 kg)  06/02/18 184 lb (83.5 kg)    Physical Exam Vitals signs and nursing note reviewed.  Constitutional:      Appearance:  She is well-developed.  HENT:     Head: Normocephalic and atraumatic.  Neck:     Musculoskeletal: Normal range of motion.  Cardiovascular:     Rate and Rhythm: Normal rate and regular rhythm.     Heart sounds: Normal heart sounds. No murmur. No friction rub. No gallop.   Pulmonary:     Effort: Pulmonary effort is normal. No tachypnea or respiratory distress.     Breath sounds: Normal breath sounds. No decreased breath sounds, wheezing, rhonchi or rales.  Chest:     Chest wall: No tenderness.  Abdominal:     General: Bowel sounds are normal.     Palpations: Abdomen is soft.  Musculoskeletal: Normal range of motion.  Feet:     Right foot:     Toenail Condition: Right toenails are abnormally thick. Fungal disease present.    Left foot:     Toenail Condition: Left toenails are abnormally thick. Fungal disease present. Skin:    General: Skin is warm and dry.  Neurological:     Mental Status: She is alert and oriented to person, place, and time.     Coordination: Coordination normal.  Psychiatric:        Behavior: Behavior normal. Behavior is cooperative.        Thought Content: Thought content normal.        Judgment: Judgment normal.          Patient has been counseled extensively about nutrition and exercise  as well as the importance of adherence with medications and regular follow-up. The patient was given clear instructions to go to ER or return to medical center if symptoms don't improve, worsen or new problems develop. The patient verbalized understanding.   Follow-up: Return in about 6 weeks (around 05/01/2019) for LFT lab appointment.   [name], FNP-BC [facility] [place], [place]   03/20/2019, 12:14 PM

## 2019-03-21 LAB — BASIC METABOLIC PANEL
BUN/Creatinine Ratio: 12 (ref 9–23)
BUN: 8 mg/dL (ref 6–24)
CO2: 24 mmol/L (ref 20–29)
Calcium: 9 mg/dL (ref 8.7–10.2)
Chloride: 101 mmol/L (ref 96–106)
Creatinine, Ser: 0.68 mg/dL (ref 0.57–1.00)
GFR calc Af Amer: 120 mL/min/{1.73_m2} (ref >59)
GFR calc non Af Amer: 104 mL/min/{1.73_m2} (ref >59)
Glucose: 128 mg/dL — ABNORMAL HIGH (ref 65–99)
Potassium: 4 mmol/L (ref 3.5–5.2)
Sodium: 137 mmol/L (ref 134–144)

## 2019-03-29 MED FILL — DILTIAZEM 24HR ER 180 MG CA: 180 | 90 days supply | Qty: 90 | Fill #0

## 2019-03-29 MED FILL — LISINOPRIL 40 MG TABLET: 40 | 90 days supply | Qty: 90 | Fill #0

## 2019-03-29 MED FILL — GLIMEPIRIDE 2 MG TABS: 2 | 90 days supply | Qty: 90 | Fill #0

## 2019-03-29 MED FILL — ?ATORVASTATIN 40MG TABLET: 40 | 90 days supply | Qty: 90 | Fill #0

## 2019-05-01 ENCOUNTER — Other Ambulatory Visit: Payer: Self-pay | Admitting: Nurse Practitioner

## 2019-05-01 ENCOUNTER — Other Ambulatory Visit: Payer: Self-pay

## 2019-05-01 ENCOUNTER — Ambulatory Visit: Payer: Self-pay | Attending: Nurse Practitioner

## 2019-05-01 DIAGNOSIS — Z13228 Encounter for screening for other metabolic disorders: Secondary | ICD-10-CM

## 2019-05-02 LAB — HEPATIC FUNCTION PANEL
ALT: 15 IU/L (ref 0–32)
AST: 10 IU/L (ref 0–40)
Albumin: 4 g/dL (ref 3.8–4.8)
Alkaline Phosphatase: 111 IU/L (ref 39–117)
Bilirubin Total: 0.4 mg/dL (ref 0.0–1.2)
Bilirubin, Direct: 0.13 mg/dL (ref 0.00–0.40)
Total Protein: 7.5 g/dL (ref 6.0–8.5)

## 2019-05-03 MED FILL — TRUE METRIX TEST STRIP: 25 days supply | Qty: 100 | Fill #2

## 2019-06-25 MED FILL — METFORMIN HCL ER 500 MG TAB: 500 | 30 days supply | Qty: 120 | Fill #1

## 2019-06-25 MED FILL — DILTIAZEM 24HR ER 180 MG CA: 180 | 90 days supply | Qty: 90 | Fill #1

## 2019-06-25 MED FILL — GLIMEPIRIDE 2 MG TABS: 2 | 90 days supply | Qty: 90 | Fill #1

## 2019-06-25 MED FILL — LISINOPRIL 40 MG TABLET: 40 | 90 days supply | Qty: 90 | Fill #1

## 2019-06-25 MED FILL — ?ATORVASTATIN 40MG TABLET: 40 | 90 days supply | Qty: 90 | Fill #1

## 2019-06-25 MED FILL — TERBINAFINE HCL 250 MG TAB: 250 | 30 days supply | Qty: 30 | Fill #1

## 2019-09-24 ENCOUNTER — Other Ambulatory Visit: Payer: Self-pay | Admitting: Nurse Practitioner

## 2019-09-24 DIAGNOSIS — I10 Essential (primary) hypertension: Secondary | ICD-10-CM

## 2019-09-24 DIAGNOSIS — E1165 Type 2 diabetes mellitus with hyperglycemia: Secondary | ICD-10-CM

## 2019-09-24 MED FILL — ?ATORVASTATIN 40MG TABLET: 40 | 90 days supply | Qty: 90 | Fill #2

## 2019-09-24 MED FILL — TRUE METRIX TEST STRIP: 50 days supply | Qty: 100 | Fill #1

## 2019-09-24 MED FILL — TERBINAFINE HCL 250 MG TAB: 250 | 24 days supply | Qty: 24 | Fill #2

## 2019-09-24 MED FILL — metFORMIN HCL ER 500 MG TB2: 500 | 30 days supply | Qty: 120 | Fill #2

## 2019-09-25 MED FILL — GLIMEPIRIDE 2 MG TABS: 2 | 30 days supply | Qty: 30 | Fill #0

## 2019-09-25 MED FILL — DILT XR 180 MG CAPSULE: 180 | 30 days supply | Qty: 30 | Fill #0

## 2019-09-25 MED FILL — LISINOPRIL 40 MG TABLET: 40 | 30 days supply | Qty: 30 | Fill #0

## 2019-10-01 ENCOUNTER — Other Ambulatory Visit: Payer: Self-pay

## 2019-10-01 ENCOUNTER — Ambulatory Visit: Payer: Self-pay | Attending: Nurse Practitioner | Admitting: Nurse Practitioner

## 2019-10-01 ENCOUNTER — Encounter: Payer: Self-pay | Admitting: Nurse Practitioner

## 2019-10-01 ENCOUNTER — Other Ambulatory Visit: Payer: Self-pay | Admitting: Nurse Practitioner

## 2019-10-01 VITALS — BP 130/83 | HR 67 | Temp 97.9°F | Ht 66.0 in | Wt 193.0 lb

## 2019-10-01 DIAGNOSIS — Z130 Encounter for screening for diseases of the blood and blood-forming organs and certain disorders involving the immune mechanism: Secondary | ICD-10-CM

## 2019-10-01 DIAGNOSIS — E785 Hyperlipidemia, unspecified: Secondary | ICD-10-CM

## 2019-10-01 DIAGNOSIS — E1165 Type 2 diabetes mellitus with hyperglycemia: Secondary | ICD-10-CM

## 2019-10-01 DIAGNOSIS — E1169 Type 2 diabetes mellitus with other specified complication: Secondary | ICD-10-CM

## 2019-10-01 DIAGNOSIS — I10 Essential (primary) hypertension: Secondary | ICD-10-CM

## 2019-10-01 LAB — GLUCOSE, POCT (MANUAL RESULT ENTRY): POC Glucose: 129 mg/dl — AB (ref 70–99)

## 2019-10-01 MED ORDER — LISINOPRIL 40 MG PO TABS: 40.0000 mg | ORAL_TABLET | Freq: Every day | ORAL | 0 refills | Status: DC

## 2019-10-01 MED ORDER — GLUCOSE BLOOD VI STRP: ORAL_STRIP | 12 refills | Status: DC

## 2019-10-01 MED ORDER — METFORMIN HCL ER 500 MG PO TB24: ORAL_TABLET | ORAL | 1 refills | Status: DC

## 2019-10-01 MED ORDER — DILTIAZEM HCL ER COATED BEADS 180 MG PO CP24: 180.0000 mg | ORAL_CAPSULE | Freq: Every day | ORAL | 0 refills | Status: DC

## 2019-10-01 MED ORDER — ATORVASTATIN CALCIUM 40 MG PO TABS: 40.0000 mg | ORAL_TABLET | Freq: Every day | ORAL | 3 refills | Status: DC

## 2019-10-01 MED ORDER — TRUEPLUS LANCETS 28G MISC: 1.0000 | Freq: Two times a day (BID) | 12 refills | Status: DC

## 2019-10-01 MED ORDER — GLIMEPIRIDE 2 MG PO TABS: 2.0000 mg | ORAL_TABLET | Freq: Every day | ORAL | 0 refills | Status: DC

## 2019-10-01 MED FILL — TRUE METRIX TEST STRIP: 25 days supply | Qty: 100 | Fill #0

## 2019-10-01 MED FILL — TRUEplus LANCETS 28G MISC: 25 days supply | Qty: 100 | Fill #0

## 2019-10-01 NOTE — Progress Notes (Signed)
Assessment & Plan:  [name] was seen today for back pain.  Diagnoses and all orders for this visit:  Type 2 diabetes mellitus with hyperglycemia, without long-term current use of insulin (HCC) -     Glucose (CBG) -     Hemoglobin A1c -     glimepiride (AMARYL) 2 MG tablet; Take 1 tablet (2 mg total) by mouth daily with breakfast. -     glucose blood test strip; USE AS DIRECTED -     Discontinue: metFORMIN (GLUCOPHAGE-XR) 500 MG 24 hr tablet; Take 2 pills twice a day at meals -     Discontinue: TRUEplus Lancets 28G MISC; 1 each by Does not apply route 2 (two) times daily. -     metFORMIN (GLUCOPHAGE-XR) 500 MG 24 hr tablet; Take 2 pills twice a day at meals -     TRUEplus Lancets 28G MISC; 1 each by Does not apply route 2 (two) times daily. -     Ambulatory referral to Ophthalmology Continue blood sugar control as discussed in office today, low carbohydrate diet, and regular physical exercise as tolerated, 150 minutes per week (30 min each day, 5 days per week, or 50 min 3 days per week). Keep blood sugar logs with fasting goal of 90-130 mg/dl, post prandial (after you eat) less than 180.  For Hypoglycemia: BS <60 and Hyperglycemia BS >400; contact the clinic ASAP. Annual eye exams and foot exams are recommended.   Essential hypertension -     CMP14+EGFR -     Discontinue: lisinopril (ZESTRIL) 40 MG tablet; Take 1 tablet (40 mg total) by mouth daily. -     diltiazem (CARDIZEM CD) 180 MG 24 hr capsule; Take 1 capsule (180 mg total) by mouth daily. -     lisinopril (ZESTRIL) 40 MG tablet; Take 1 tablet (40 mg total) by mouth daily. Continue all antihypertensives as prescribed.  Remember to bring in your blood pressure log with you for your follow up appointment.  DASH/Mediterranean Diets are healthier choices for HTN.   Hyperlipidemia associated with type 2 diabetes mellitus (HCC) -     Lipid panel -     atorvastatin (LIPITOR) 40 MG tablet; Take 1 tablet (40 mg total) by mouth daily  at 6 PM. INSTRUCTIONS: Work on a low fat, heart healthy diet and participate in regular aerobic exercise program by working out at least 150 minutes per week; 5 days a week-30 minutes per day. Avoid red meat/beef/steak,  fried foods. junk foods, sodas, sugary drinks, unhealthy snacking, alcohol and smoking.  Drink at least 80 oz of water per day and monitor your carbohydrate intake daily.    Screening for deficiency anemia -     CBC    Patient has been counseled on age-appropriate routine health concerns for screening and prevention. These are reviewed and up-to-date. Referrals have been placed accordingly. Immunizations are up-to-date or declined.    Subjective:   Chief Complaint  Patient presents with  . Back Pain    Pt. stated her back hurt sometimes, when she drink diet soda she gets pain right back side.    HPI Isabella Joyce 49 y.o. female presents to office today for follow up.  VRI was used to communicate directly with patient for the entire encounter including providing detailed patient instructions.    HEALTH MAINTENANCE Referred to [facility] today  She has put on some weight. Notes increased stressed and depression the last 3 months as her mother recently suffered a heart attack  and passed. Has good support system from spouse and children.    DM TYPE 2 Well controlled. Taking glimepiride 2 mg daily and metformin XR 1000 mg daily.  Monitoring her blood glucose at home 1-2 times per day . This morning's reading: 112. Denies any hypo or hyperglycemic symptoms.  Lab Results  Component Value Date   HGBA1C 6.3 (A) 03/20/2019   Essential Hypertension Well controlled. Taking cardizem CD 180 mg daily and lisinopril 40 mg daily. Denies chest pain, shortness of breath, palpitations, lightheadedness, dizziness, headaches or BLE edema.  BP Readings from Last 3 Encounters:  10/01/19 130/83  03/20/19 128/84  09/01/18 114/76    Dyslipidemia LDL nearing goal of <70. Taking  atorvastatin 40 mg daily as prescribed. Denies statin intolerance.  Lab Results  Component Value Date   LDLCALC 71 09/01/2018    Review of Systems  Constitutional: Negative for fever, malaise/fatigue and weight loss.  HENT: Positive for hearing loss. Negative for nosebleeds.   Eyes: Negative.  Negative for blurred vision, double vision and photophobia.  Respiratory: Negative.  Negative for cough and shortness of breath.   Cardiovascular: Negative.  Negative for chest pain, palpitations and leg swelling.  Gastrointestinal: Negative.  Negative for heartburn, nausea and vomiting.  Musculoskeletal: Negative.  Negative for myalgias.  Neurological: Negative.  Negative for dizziness, focal weakness, seizures and headaches.  Psychiatric/Behavioral: Positive for depression. Negative for suicidal ideas.    Past Medical History:  Diagnosis Date  . COVID-19 virus infection 12/12/2018  . Diabetes mellitus without complication ([place]) 5732  . Hearing deficit   . Hyperlipidemia   . Hypertension 2008    History reviewed. No pertinent surgical history.  Family History  Problem Relation Age of Onset  . Diabetes Mother   . Hypertension Mother   . Diabetes Father   . Hypertension Father   . Cancer Neg Hx   . Heart disease Neg Hx     Social History Reviewed with no changes to be made today.   Outpatient Medications Prior to Visit  Medication Sig Dispense Refill  . Blood Glucose Monitoring Suppl (TRUE METRIX METER) w/Device KIT USE AS DIRECTED 2 TIMES DAILY 1 kit 0  . terbinafine (LAMISIL) 250 MG tablet Take 250 mg by mouth daily.    Marland Kitchen atorvastatin (LIPITOR) 40 MG tablet Take 1 tablet (40 mg total) by mouth daily at 6 PM. 90 tablet 3  . diltiazem (CARDIZEM CD) 180 MG 24 hr capsule TAKE 1 CAPSULE (180 MG TOTAL) BY MOUTH DAILY. 30 capsule 0  . glimepiride (AMARYL) 2 MG tablet TAKE 1 TABLET BY MOUTH DAILY BEFORE BREAKFAST. 30 tablet 0  . glucose blood test strip USE AS DIRECTED 100 each 12  .  lisinopril (ZESTRIL) 40 MG tablet TAKE 1 TABLET (40 MG TOTAL) BY MOUTH DAILY. 30 tablet 0  . metFORMIN (GLUCOPHAGE-XR) 500 MG 24 hr tablet Take 2 pills twice a day at meals 180 tablet 1  . TRUEplus Lancets 28G MISC 1 each by Does not apply route 2 (two) times daily. 100 each 12   No facility-administered medications prior to visit.    Allergies  Allergen Reactions  . Pollen Extract     Hear swelling       Objective:    BP 130/83 (BP Location: Left Arm, Patient Position: Sitting, Cuff Size: Normal)   Pulse 67   Temp 97.9 F (36.6 C) (Temporal)   Ht '5\' 6"'  (1.676 m)   Wt 193 lb (87.5 kg)   LMP 06/16/2017  SpO2 98%   BMI 31.15 kg/m  Wt Readings from Last 3 Encounters:  10/01/19 193 lb (87.5 kg)  03/20/19 185 lb 3.2 oz (84 kg)  09/01/18 190 lb (86.2 kg)    Physical Exam Vitals and nursing note reviewed.  Constitutional:      Appearance: She is well-developed.  HENT:     Head: Normocephalic and atraumatic.     Right Ear: Decreased hearing noted.     Left Ear: Decreased hearing noted.  Cardiovascular:     Rate and Rhythm: Normal rate and regular rhythm.     Pulses:          Dorsalis pedis pulses are 2+ on the right side and 2+ on the left side.       Posterior tibial pulses are 2+ on the right side and 2+ on the left side.     Heart sounds: Normal heart sounds. No murmur. No friction rub. No gallop.   Pulmonary:     Effort: Pulmonary effort is normal. No tachypnea or respiratory distress.     Breath sounds: Normal breath sounds. No decreased breath sounds, wheezing, rhonchi or rales.  Chest:     Chest wall: No tenderness.  Abdominal:     General: Bowel sounds are normal.     Palpations: Abdomen is soft.  Musculoskeletal:        General: Normal range of motion.     Cervical back: Normal range of motion.  Feet:     Right foot:     Protective Sensation: 10 sites tested. 10 sites sensed.     Toenail Condition: Fungal disease present.    Left foot:     Protective  Sensation: 10 sites tested. 10 sites sensed.     Toenail Condition: Fungal disease present. Skin:    General: Skin is warm and dry.  Neurological:     Mental Status: She is alert and oriented to person, place, and time.     Coordination: Coordination normal.  Psychiatric:        Behavior: Behavior normal. Behavior is cooperative.        Thought Content: Thought content normal.        Judgment: Judgment normal.          Patient has been counseled extensively about nutrition and exercise as well as the importance of adherence with medications and regular follow-up. The patient was given clear instructions to go to ER or return to medical center if symptoms don't improve, worsen or new problems develop. The patient verbalized understanding.   Follow-up: Return for PAP SMEAR .   [name], FNP-BC [facility] and [facility], [place]   10/01/2019, 4:22 PM

## 2019-10-02 LAB — CBC
Hematocrit: 39.3 % (ref 34.0–46.6)
Hemoglobin: 13.3 g/dL (ref 11.1–15.9)
MCH: 30.6 pg (ref 26.6–33.0)
MCHC: 33.8 g/dL (ref 31.5–35.7)
MCV: 91 fL (ref 79–97)
Platelets: 272 10*3/uL (ref 150–450)
RBC: 4.34 x10E6/uL (ref 3.77–5.28)
RDW: 14.2 % (ref 11.7–15.4)
WBC: 6.6 10*3/uL (ref 3.4–10.8)

## 2019-10-02 LAB — CMP14+EGFR
ALT: 22 IU/L (ref 0–32)
AST: 18 IU/L (ref 0–40)
Albumin/Globulin Ratio: 1.5 (ref 1.2–2.2)
Albumin: 4 g/dL (ref 3.8–4.8)
Alkaline Phosphatase: 102 IU/L (ref 39–117)
BUN/Creatinine Ratio: 18 (ref 9–23)
BUN: 10 mg/dL (ref 6–24)
Bilirubin Total: 0.3 mg/dL (ref 0.0–1.2)
CO2: 21 mmol/L (ref 20–29)
Calcium: 9 mg/dL (ref 8.7–10.2)
Chloride: 107 mmol/L — ABNORMAL HIGH (ref 96–106)
Creatinine, Ser: 0.56 mg/dL — ABNORMAL LOW (ref 0.57–1.00)
GFR calc Af Amer: 128 mL/min/{1.73_m2} (ref >59)
GFR calc non Af Amer: 111 mL/min/{1.73_m2} (ref >59)
Globulin, Total: 2.6 g/dL (ref 1.5–4.5)
Glucose: 116 mg/dL — ABNORMAL HIGH (ref 65–99)
Potassium: 4 mmol/L (ref 3.5–5.2)
Sodium: 141 mmol/L (ref 134–144)
Total Protein: 6.6 g/dL (ref 6.0–8.5)

## 2019-10-02 LAB — LIPID PANEL
Chol/HDL Ratio: 2.4 ratio (ref 0.0–4.4)
Cholesterol, Total: 114 mg/dL (ref 100–199)
HDL: 48 mg/dL (ref >39)
LDL Chol Calc (NIH): 45 mg/dL (ref 0–99)
Triglycerides: 113 mg/dL (ref 0–149)
VLDL Cholesterol Cal: 21 mg/dL (ref 5–40)

## 2019-10-02 LAB — HEMOGLOBIN A1C
Est. average glucose Bld gHb Est-mCnc: 160 mg/dL
Hgb A1c MFr Bld: 7.2 % — ABNORMAL HIGH (ref 4.8–5.6)

## 2019-10-25 ENCOUNTER — Other Ambulatory Visit: Payer: Self-pay | Admitting: Family Medicine

## 2019-10-25 ENCOUNTER — Other Ambulatory Visit: Payer: Self-pay | Admitting: Nurse Practitioner

## 2019-10-25 DIAGNOSIS — I10 Essential (primary) hypertension: Secondary | ICD-10-CM

## 2019-10-26 ENCOUNTER — Other Ambulatory Visit: Payer: Self-pay

## 2019-10-26 DIAGNOSIS — Z1231 Encounter for screening mammogram for malignant neoplasm of breast: Secondary | ICD-10-CM

## 2019-10-29 ENCOUNTER — Other Ambulatory Visit: Payer: Self-pay | Admitting: Obstetrics and Gynecology

## 2019-10-29 DIAGNOSIS — Z1231 Encounter for screening mammogram for malignant neoplasm of breast: Secondary | ICD-10-CM

## 2019-11-05 ENCOUNTER — Encounter: Payer: Self-pay | Admitting: Nurse Practitioner

## 2019-11-05 ENCOUNTER — Other Ambulatory Visit: Payer: Self-pay

## 2019-11-05 ENCOUNTER — Ambulatory Visit: Payer: Self-pay | Attending: Nurse Practitioner | Admitting: Nurse Practitioner

## 2019-11-05 VITALS — BP 122/81 | HR 83 | Temp 97.7°F | Ht 66.0 in | Wt 190.0 lb

## 2019-11-05 DIAGNOSIS — Z30432 Encounter for removal of intrauterine contraceptive device: Secondary | ICD-10-CM

## 2019-11-05 DIAGNOSIS — Z124 Encounter for screening for malignant neoplasm of cervix: Secondary | ICD-10-CM

## 2019-11-05 DIAGNOSIS — Z114 Encounter for screening for human immunodeficiency virus [HIV]: Secondary | ICD-10-CM

## 2019-11-05 DIAGNOSIS — E1165 Type 2 diabetes mellitus with hyperglycemia: Secondary | ICD-10-CM

## 2019-11-05 LAB — GLUCOSE, POCT (MANUAL RESULT ENTRY): POC Glucose: 166 mg/dl — AB (ref 70–99)

## 2019-11-05 NOTE — Progress Notes (Signed)
Assessment & Plan:  Isabella Joyce was seen today for gynecologic exam.  Diagnoses and all orders for this visit:  Encounter for Papanicolaou smear for cervical cancer screening -     Cytology - PAP -     Cervicovaginal ancillary only  Type 2 diabetes mellitus with hyperglycemia, without long-term current use of insulin (HCC) -     Glucose (CBG)  Encounter for screening for HIV -     HIV antibody (with reflex)  Encounter for IUD removal -     Ambulatory referral to Gynecology Referred to Dr. Juleen China for IUD removal.  She has a 5 year IUD that has been in place for over 6 years.      Patient has been counseled on age-appropriate routine health concerns for screening and prevention. These are reviewed and up-to-date. Referrals have been placed accordingly. Immunizations are up-to-date or declined.    Subjective:   Chief Complaint  Patient presents with  . Gynecologic Exam    Pt. is here for a pap smear.    HPI Isabella Joyce 49 y.o. female presents to office today   Review of Systems  Constitutional: Negative.  Negative for chills, fever, malaise/fatigue and weight loss.  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   Respiratory: Negative.  Negative for cough, shortness of breath and wheezing.   Cardiovascular: Negative.  Negative for chest pain, orthopnea and leg swelling.  Gastrointestinal: Negative for abdominal pain.  Genitourinary: Negative.  Negative for flank pain.  Skin: Negative.  Negative for rash.  Psychiatric/Behavioral: Negative for suicidal ideas.    Past Medical History:  Diagnosis Date  . COVID-19 virus infection 12/12/2018  . Diabetes mellitus without complication ([place]) 7591  . Hearing deficit   . Hyperlipidemia   . Hypertension 2008    History reviewed. No pertinent surgical history.  Family History  Problem Relation Age of Onset  . Diabetes Mother   . Hypertension Mother   . Diabetes Father   . Hypertension Father    . Cancer Neg Hx   . Heart disease Neg Hx     Social History Reviewed with no changes to be made today.   Outpatient Medications Prior to Visit  Medication Sig Dispense Refill  . atorvastatin (LIPITOR) 40 MG tablet Take 1 tablet (40 mg total) by mouth daily at 6 PM. 90 tablet 3  . Blood Glucose Monitoring Suppl (TRUE METRIX METER) w/Device KIT USE AS DIRECTED 2 TIMES DAILY 1 kit 0  . DILT-XR 180 MG 24 hr capsule TAKE 1 CAPSULE (180 MG TOTAL) BY MOUTH DAILY. 30 capsule 2  . diltiazem (CARDIZEM CD) 180 MG 24 hr capsule Take 1 capsule (180 mg total) by mouth daily. 90 capsule 0  . glimepiride (AMARYL) 2 MG tablet Take 1 tablet (2 mg total) by mouth daily with breakfast. 90 tablet 0  . glucose blood test strip USE AS DIRECTED 100 each 12  . lisinopril (ZESTRIL) 40 MG tablet Take 1 tablet (40 mg total) by mouth daily. 90 tablet 0  . metFORMIN (GLUCOPHAGE-XR) 500 MG 24 hr tablet Take 2 pills twice a day at meals 180 tablet 1  . terbinafine (LAMISIL) 250 MG tablet TAKE 1 TABLET (250 MG TOTAL) BY MOUTH DAILY. 30 tablet 1  . TRUEplus Lancets 28G MISC 1 each by Does not apply route 2 (two) times daily. 100 each 12   No facility-administered medications prior to visit.    Allergies  Allergen Reactions  . Pollen  Extract     Hear swelling       Objective:    BP 122/81 (BP Location: Left Arm, Patient Position: Sitting, Cuff Size: Normal)   Pulse 83   Temp 97.7 F (36.5 C) (Temporal)   Ht '5\' 6"'  (1.676 m)   Wt 190 lb (86.2 kg)   LMP 06/16/2017   SpO2 97%   BMI 30.67 kg/m  Wt Readings from Last 3 Encounters:  11/05/19 190 lb (86.2 kg)  10/01/19 193 lb (87.5 kg)  03/20/19 185 lb 3.2 oz (84 kg)    Physical Exam Exam conducted with a chaperone present.  Constitutional:      Appearance: She is well-developed.  HENT:     Head: Normocephalic.  Cardiovascular:     Rate and Rhythm: Normal rate and regular rhythm.     Heart sounds: Normal heart sounds.  Pulmonary:     Effort:  Pulmonary effort is normal.     Breath sounds: Normal breath sounds.  Abdominal:     General: Bowel sounds are normal.     Palpations: Abdomen is soft.     Hernia: There is no hernia in the left inguinal area.  Genitourinary:    Exam position: Lithotomy position.     Labia:        Right: No rash, tenderness, lesion or injury.        Left: No rash, tenderness, lesion or injury.      Vagina: Normal. No signs of injury and foreign body. No vaginal discharge, erythema, tenderness or bleeding.     Cervix: Cervical bleeding present. No cervical motion tenderness or friability.     Uterus: Not enlarged.      Adnexa:        Right: No mass, tenderness or fullness.         Left: No mass, tenderness or fullness.       Rectum: Normal. No external hemorrhoid.     Comments: IUD strings intact/visible Lymphadenopathy:     Lower Body: No right inguinal adenopathy. No left inguinal adenopathy.  Skin:    General: Skin is warm and dry.  Neurological:     Mental Status: She is alert and oriented to person, place, and time.  Psychiatric:        Behavior: Behavior normal.        Thought Content: Thought content normal.        Judgment: Judgment normal.          Patient has been counseled extensively about nutrition and exercise as well as the importance of adherence with medications and regular follow-up. The patient was given clear instructions to go to ER or return to medical center if symptoms don't improve, worsen or new problems develop. The patient verbalized understanding.   Follow-up: No follow-ups on file.   [name], FNP-BC [facility] [place], [place]   11/05/2019, 3:12 PM

## 2019-11-06 ENCOUNTER — Other Ambulatory Visit: Payer: Self-pay | Admitting: Nurse Practitioner

## 2019-11-06 LAB — CERVICOVAGINAL ANCILLARY ONLY
Bacterial Vaginitis (gardnerella): POSITIVE — AB
Candida Glabrata: NEGATIVE
Candida Vaginitis: NEGATIVE
Chlamydia: NEGATIVE
Comment: NEGATIVE
Comment: NEGATIVE
Comment: NEGATIVE
Comment: NEGATIVE
Comment: NEGATIVE
Comment: NORMAL
Neisseria Gonorrhea: NEGATIVE
Trichomonas: NEGATIVE

## 2019-11-06 LAB — HIV ANTIBODY (ROUTINE TESTING W REFLEX): HIV Screen 4th Generation wRfx: NONREACTIVE

## 2019-11-06 MED ORDER — METRONIDAZOLE 500 MG PO TABS: 500.0000 mg | ORAL_TABLET | Freq: Two times a day (BID) | ORAL | 0 refills | Status: AC

## 2019-11-07 LAB — CYTOLOGY - PAP
Comment: NEGATIVE
Diagnosis: NEGATIVE
Diagnosis: REACTIVE
High risk HPV: NEGATIVE

## 2019-11-07 MED FILL — metroNIDAZOLE 500 MG TABS: 500 | 7 days supply | Qty: 14 | Fill #0

## 2019-11-21 ENCOUNTER — Telehealth: Payer: Self-pay

## 2019-11-21 NOTE — Telephone Encounter (Signed)
Called patient to do their pre-visit COVID screening.  Call went to voicemail. Unable to do prescreening.  

## 2019-11-22 ENCOUNTER — Ambulatory Visit: Payer: Self-pay | Admitting: Internal Medicine

## 2019-11-22 ENCOUNTER — Ambulatory Visit: Payer: Self-pay | Admitting: *Deleted

## 2019-11-22 ENCOUNTER — Ambulatory Visit
Admission: RE | Admit: 2019-11-22 | Discharge: 2019-11-22 | Disposition: A | Discharge status: Home / Self Care | Payer: No Typology Code available for payment source | Source: Ambulatory Visit | Attending: Obstetrics and Gynecology | Admitting: Obstetrics and Gynecology

## 2019-11-22 ENCOUNTER — Other Ambulatory Visit: Payer: Self-pay

## 2019-11-22 VITALS — BP 138/88 | Temp 97.3°F | Wt 186.3 lb

## 2019-11-22 DIAGNOSIS — Z1239 Encounter for other screening for malignant neoplasm of breast: Secondary | ICD-10-CM

## 2019-11-22 DIAGNOSIS — Z1231 Encounter for screening mammogram for malignant neoplasm of breast: Secondary | ICD-10-CM

## 2019-11-22 NOTE — Patient Instructions (Signed)
Explained breast self awareness with [name]. Patient did not need a Pap smear today due to last Pap smear was 11/05/2019. Let her know BCCCP will cover Pap smears and HPV typing every 5 years unless has a history of abnormal Pap smears. Referred patient to the [facility] for a screening mammogram. Appointment scheduled Thursday, November 22, 2019 at 1350 at the mobile unit. Patient aware of appointment and will be there. Let patient know the Breast Center will follow up with her within the next couple weeks with results of her mammogram by letter or phone. Isabella Joyce verbalized understanding.  Tkai Serfass, Kathaleen Maser, RN 1:14 PM

## 2019-11-22 NOTE — Progress Notes (Signed)
[name] is a 49 y.o. female who presents to [facility] clinic today with no complaints.   Pap Smear: Pap not smear completed today. Last Pap smear was 11/05/2019 at [facility] and was normal with negative HPV. Per patient has no history of an abnormal Pap smear. Last two Pap smear results are in Epic.   Physical exam: Breasts Breasts symmetrical. No skin abnormalities bilateral breasts. No nipple retraction bilateral breasts. No nipple discharge bilateral breasts. No lymphadenopathy. No lumps palpated bilateral breasts. No complaints of pain or tenderness on exam.      Pelvic/Bimanual Pap is not indicated today per BCCCP guidelines.    Smoking History: Patient has never smoked.   Patient Navigation: Patient education provided. Access to services provided for patient through [place] program. Spanish interpreter Natale Lay from [facility] provided.   Breast and Cervical Cancer Risk Assessment: Patient has no family history of breast cancer, known genetic mutations, or radiation treatment to the chest before age 29. Patient has no history of cervical dysplasia, immunocompromised, or DES exposure in-utero.  Risk Assessment    Risk Scores      11/22/2019   Last edited by: Priscille Heidelberg, RN   5-year risk: 0.6 %   Lifetime risk: 6.4 %          A: BCCCP exam without pap smear  P: Referred patient to the [facility] for a screening mammogram. Appointment scheduled Thursday, November 22, 2019 at 1350 at the mobile unit.  Priscille Heidelberg, RN 11/22/2019 1:15 PM

## 2019-12-25 ENCOUNTER — Other Ambulatory Visit: Payer: Self-pay | Admitting: Family Medicine

## 2019-12-25 MED FILL — LISINOPRIL 40 MG TABLET: 40 | 30 days supply | Qty: 30 | Fill #2

## 2019-12-25 MED FILL — metFORMIN HCL ER 500 MG TB2: 500 | 30 days supply | Qty: 60 | Fill #2

## 2019-12-25 MED FILL — GLIMEPIRIDE 2 MG TABS: 2 | 30 days supply | Qty: 30 | Fill #2

## 2019-12-25 MED FILL — ?ATORVASTATIN 40MG TABLET: 40 | 30 days supply | Qty: 30 | Fill #0

## 2019-12-25 MED FILL — DILTIAZEM 24HR ER 180 MG CA: 180 | 30 days supply | Qty: 30 | Fill #2

## 2019-12-25 NOTE — Telephone Encounter (Signed)
Requested medication (s) are due for refill today: yes  Requested medication (s) are on the active medication list: yes  Last refill:  10/26/19  Future visit scheduled: no  Notes to clinic:  medication not assigned to a protocol, review manually   Requested Prescriptions  Pending Prescriptions Disp Refills   terbinafine (LAMISIL) 250 MG tablet [Pharmacy Med Name: TERBINAFINE HCL 250 MG TAB 250 Tablet] 30 tablet 1    Sig: TAKE 1 TABLET (250 MG TOTAL) BY MOUTH DAILY.      Off-Protocol Failed - 12/25/2019  2:43 PM      Failed - Medication not assigned to a protocol, review manually.      Passed - Valid encounter within last 12 months    Recent Outpatient Visits           1 month ago Encounter for Papanicolaou smear for cervical cancer screening   [place] [facility] [place], [place] W, NP   2 months ago Type 2 diabetes mellitus with hyperglycemia, without long-term current use of insulin [facility])   [place] [facility] [place], [place] W, NP   9 months ago Type 2 diabetes mellitus with hyperglycemia, without long-term current use of insulin [facility])   [place] [facility] [place], Shea Stakes, NP   1 year ago COVID-19 virus infection   [facility] Storm Frisk, MD   1 year ago COVID-19 virus infection   [facility] Storm Frisk, MD

## 2019-12-26 NOTE — Telephone Encounter (Signed)
She needs LFT for labs prior to filling Lamisil

## 2020-01-14 ENCOUNTER — Other Ambulatory Visit: Payer: Self-pay

## 2020-01-14 ENCOUNTER — Ambulatory Visit: Payer: No Typology Code available for payment source | Attending: Nurse Practitioner

## 2020-01-14 DIAGNOSIS — E1165 Type 2 diabetes mellitus with hyperglycemia: Secondary | ICD-10-CM

## 2020-01-14 NOTE — Telephone Encounter (Signed)
CMA spoke to patient and scheduled patient to come in this afternoon for lab. Pt understood. [place] interpreter assist w/ the call.

## 2020-01-15 LAB — CMP14+EGFR
ALT: 25 IU/L (ref 0–32)
AST: 14 IU/L (ref 0–40)
Albumin/Globulin Ratio: 1.3 (ref 1.2–2.2)
Albumin: 4.1 g/dL (ref 3.8–4.8)
Alkaline Phosphatase: 114 IU/L (ref 48–121)
BUN/Creatinine Ratio: 19 (ref 9–23)
BUN: 17 mg/dL (ref 6–24)
Bilirubin Total: 0.5 mg/dL (ref 0.0–1.2)
CO2: 24 mmol/L (ref 20–29)
Calcium: 8.8 mg/dL (ref 8.7–10.2)
Chloride: 104 mmol/L (ref 96–106)
Creatinine, Ser: 0.88 mg/dL (ref 0.57–1.00)
GFR calc Af Amer: 90 mL/min/{1.73_m2} (ref >59)
GFR calc non Af Amer: 78 mL/min/{1.73_m2} (ref >59)
Globulin, Total: 3.1 g/dL (ref 1.5–4.5)
Glucose: 146 mg/dL — ABNORMAL HIGH (ref 65–99)
Potassium: 4 mmol/L (ref 3.5–5.2)
Sodium: 140 mmol/L (ref 134–144)
Total Protein: 7.2 g/dL (ref 6.0–8.5)

## 2020-01-15 LAB — CBC
Hematocrit: 41.9 % (ref 34.0–46.6)
Hemoglobin: 14.3 g/dL (ref 11.1–15.9)
MCH: 31.6 pg (ref 26.6–33.0)
MCHC: 34.1 g/dL (ref 31.5–35.7)
MCV: 93 fL (ref 79–97)
Platelets: 221 10*3/uL (ref 150–450)
RBC: 4.53 x10E6/uL (ref 3.77–5.28)
RDW: 13.2 % (ref 11.7–15.4)
WBC: 7.9 10*3/uL (ref 3.4–10.8)

## 2020-01-15 LAB — HEMOGLOBIN A1C
Est. average glucose Bld gHb Est-mCnc: 160 mg/dL
Hgb A1c MFr Bld: 7.2 % — ABNORMAL HIGH (ref 4.8–5.6)

## 2020-01-15 NOTE — Telephone Encounter (Signed)
Will route to PCP for refill request.

## 2020-01-17 ENCOUNTER — Other Ambulatory Visit: Payer: Self-pay

## 2020-01-17 DIAGNOSIS — E1165 Type 2 diabetes mellitus with hyperglycemia: Secondary | ICD-10-CM

## 2020-01-17 MED ORDER — TRUE METRIX METER W/DEVICE KIT: PACK | 0 refills | Status: AC

## 2020-01-17 MED FILL — TRUE METRIX GO GLUCOSE METE: W/DEVICE | 1 days supply | Qty: 1 | Fill #0

## 2020-01-17 MED FILL — TERBINAFINE HCL 250 MG TABS: 250 | 30 days supply | Qty: 30 | Fill #0

## 2020-01-17 NOTE — Progress Notes (Signed)
Pt. Request a new glucometer machine b/c her machine is not working.

## 2020-01-17 NOTE — Telephone Encounter (Signed)
CMA spoke to patient and informed she can pick up her medication. Pt. Is aware. Spanish interpreter assist w/ the call.

## 2020-01-23 ENCOUNTER — Other Ambulatory Visit: Payer: Self-pay | Admitting: Family Medicine

## 2020-01-23 ENCOUNTER — Other Ambulatory Visit: Payer: Self-pay | Admitting: Nurse Practitioner

## 2020-01-23 DIAGNOSIS — E1165 Type 2 diabetes mellitus with hyperglycemia: Secondary | ICD-10-CM

## 2020-01-23 DIAGNOSIS — I10 Essential (primary) hypertension: Secondary | ICD-10-CM

## 2020-01-23 MED FILL — LISINOPRIL 40 MG TABLET: 40 | 30 days supply | Qty: 30 | Fill #0

## 2020-01-23 MED FILL — metFORMIN HCL ER 500 MG TB2: 500 | 30 days supply | Qty: 60 | Fill #3

## 2020-01-23 MED FILL — DILTIAZEM 24HR ER 180 MG CA: 180 | 30 days supply | Qty: 30 | Fill #0

## 2020-01-23 MED FILL — ATORVASTATIN CALCIUM 40 MG: 40 | 30 days supply | Qty: 30 | Fill #1

## 2020-01-23 NOTE — Telephone Encounter (Signed)
Requested medication (s) are due for refill today: yes  Requested medication (s) are on the active medication list: yes but prescription expired on 12/30/19  Last refill:  10/01/19  Future visit scheduled: no  Notes to clinic:  Please review for refill. Prescription written on 10/01/19 #90 expired on 12/30/19     Requested Prescriptions  Pending Prescriptions Disp Refills   glimepiride (AMARYL) 2 MG tablet [Pharmacy Med Name: GLIMEPIRIDE 2 MG TABS 2 Tablet] 30 tablet 0    Sig: Take 1 tablet (2 mg total) by mouth daily with breakfast.      Endocrinology:  Diabetes - Sulfonylureas Passed - 01/23/2020  4:11 PM      Passed - HBA1C is between 0 and 7.9 and within 180 days    HbA1c, POC (prediabetic range)  Date Value Ref Range Status  06/02/2018 6.4 5.7 - 6.4 % Final   Hgb A1c MFr Bld  Date Value Ref Range Status  01/14/2020 7.2 (H) 4.8 - 5.6 % Final    Comment:             Prediabetes: 5.7 - 6.4          Diabetes: >6.4          Glycemic control for adults with diabetes: <7.0           Passed - Valid encounter within last 6 months    Recent Outpatient Visits           2 months ago Encounter for Papanicolaou smear for cervical cancer screening   [place] [facility] [place], [place] W, NP   3 months ago Type 2 diabetes mellitus with hyperglycemia, without long-term current use of insulin [facility])   [place] [facility] [place], [place] W, NP   10 months ago Type 2 diabetes mellitus with hyperglycemia, without long-term current use of insulin [facility])   [place] [facility] [place], Shea Stakes, NP   1 year ago COVID-19 virus infection   [place] Storm Frisk, MD   1 year ago COVID-19 virus infection   [place] [facility] Storm Frisk, MD

## 2020-01-24 ENCOUNTER — Other Ambulatory Visit: Payer: Self-pay | Admitting: Family Medicine

## 2020-01-25 MED FILL — GLIMEPIRIDE 2 MG TABS: 2 | 30 days supply | Qty: 30 | Fill #0

## 2020-02-22 MED FILL — TERBINAFINE HCL 250 MG TABS: 250 | 30 days supply | Qty: 30 | Fill #1

## 2020-02-22 MED FILL — ATORVASTATIN CALCIUM 40 MG: 40 | 30 days supply | Qty: 30 | Fill #2

## 2020-02-22 MED FILL — GLIMEPIRIDE 2 MG TABS: 2 | 30 days supply | Qty: 30 | Fill #1

## 2020-02-22 MED FILL — DILTIAZEM 24HR ER 180 MG CA: 180 | 30 days supply | Qty: 30 | Fill #1

## 2020-02-22 MED FILL — metFORMIN HCL ER 500 MG TB2: 500 | 30 days supply | Qty: 60 | Fill #4

## 2020-02-22 MED FILL — TRUE METRIX TEST STRIP: 25 days supply | Qty: 100 | Fill #1

## 2020-02-22 MED FILL — TRUEplus LANCETS 28G MISC: 100 days supply | Qty: 100 | Fill #1

## 2020-02-22 MED FILL — LISINOPRIL 40 MG TABLET: 40 | 30 days supply | Qty: 30 | Fill #1

## 2020-03-24 MED FILL — ATORVASTATIN CALCIUM 40 MG: 40 | 30 days supply | Qty: 30 | Fill #3

## 2020-03-24 MED FILL — DILTIAZEM 24HR ER 180 MG CA: 180 | 30 days supply | Qty: 30 | Fill #2

## 2020-03-24 MED FILL — metFORMIN HCL ER 500 MG TB2: 500 | 30 days supply | Qty: 60 | Fill #5

## 2020-03-24 MED FILL — LISINOPRIL 40 MG TABLET: 40 | 30 days supply | Qty: 30 | Fill #2

## 2020-03-24 MED FILL — TRUE METRIX TEST STRIP: 25 days supply | Qty: 100 | Fill #2

## 2020-03-24 MED FILL — GLIMEPIRIDE 2 MG TABS: 2 | 30 days supply | Qty: 30 | Fill #2

## 2020-04-21 ENCOUNTER — Other Ambulatory Visit: Payer: Self-pay | Admitting: Nurse Practitioner

## 2020-04-21 ENCOUNTER — Other Ambulatory Visit: Payer: Self-pay | Admitting: Family Medicine

## 2020-04-21 DIAGNOSIS — E1165 Type 2 diabetes mellitus with hyperglycemia: Secondary | ICD-10-CM

## 2020-04-21 MED FILL — DILTIAZEM 24HR ER 180 MG CA: 180 | 30 days supply | Qty: 30 | Fill #0

## 2020-04-21 MED FILL — GLIMEPIRIDE 2 MG TABS: 2 | 30 days supply | Qty: 30 | Fill #0

## 2020-04-21 MED FILL — LISINOPRIL 40 MG TABLET: 40 | 30 days supply | Qty: 30 | Fill #0

## 2020-04-21 MED FILL — ATORVASTATIN CALCIUM 40 MG: 40 | 30 days supply | Qty: 30 | Fill #4

## 2020-04-21 MED FILL — metFORMIN HCL ER 500 MG TB2: 500 | 30 days supply | Qty: 120 | Fill #0

## 2020-04-21 NOTE — Telephone Encounter (Signed)
Requested Prescriptions  Pending Prescriptions Disp Refills   glimepiride (AMARYL) 2 MG tablet [Pharmacy Med Name: GLIMEPIRIDE 2 MG TABS 2 Tablet] 90 tablet 0    Sig: TAKE 1 TABLET (2 MG TOTAL) BY MOUTH DAILY WITH BREAKFAST.     Endocrinology:  Diabetes - Sulfonylureas Passed - 04/21/2020  3:41 PM      Passed - HBA1C is between 0 and 7.9 and within 180 days    HbA1c, POC (prediabetic range)  Date Value Ref Range Status  06/02/2018 6.4 5.7 - 6.4 % Final   Hgb A1c MFr Bld  Date Value Ref Range Status  01/14/2020 7.2 (H) 4.8 - 5.6 % Final    Comment:             Prediabetes: 5.7 - 6.4          Diabetes: >6.4          Glycemic control for adults with diabetes: <7.0          Passed - Valid encounter within last 6 months    Recent Outpatient Visits          5 months ago Encounter for Papanicolaou smear for cervical cancer screening   [place] [facility] [place], [place] W, NP   6 months ago Type 2 diabetes mellitus with hyperglycemia, without long-term current use of insulin [facility])   [place] [facility] [place], [place] W, NP   1 year ago Type 2 diabetes mellitus with hyperglycemia, without long-term current use of insulin [facility])   [place] [facility] [place], Shea Stakes, NP   1 year ago COVID-19 virus infection   [facility] Storm Frisk, MD   1 year ago COVID-19 virus infection   [facility] Storm Frisk, MD

## 2020-04-21 NOTE — Telephone Encounter (Signed)
Requested Prescriptions  Pending Prescriptions Disp Refills  . metFORMIN (GLUCOPHAGE-XR) 500 MG 24 hr tablet [Pharmacy Med Name: metFORMIN HCL ER 500 MG TB2 500 Tablet] 180 tablet 0    Sig: TAKE 2 TABLETS TWICE A DAY AT MEALS     Endocrinology:  Diabetes - Biguanides Passed - 04/21/2020  3:40 PM      Passed - Cr in normal range and within 360 days    Creat  Date Value Ref Range Status  10/20/2015 0.53 0.50 - 1.10 mg/dL Final   Creatinine, Ser  Date Value Ref Range Status  01/14/2020 0.88 0.57 - 1.00 mg/dL Final   Creatinine, Urine  Date Value Ref Range Status  10/20/2015 54 20 - 320 mg/dL Final         Passed - HBA1C is between 0 and 7.9 and within 180 days    HbA1c, POC (prediabetic range)  Date Value Ref Range Status  06/02/2018 6.4 5.7 - 6.4 % Final   Hgb A1c MFr Bld  Date Value Ref Range Status  01/14/2020 7.2 (H) 4.8 - 5.6 % Final    Comment:             Prediabetes: 5.7 - 6.4          Diabetes: >6.4          Glycemic control for adults with diabetes: <7.0          Passed - eGFR in normal range and within 360 days    GFR, Est African American  Date Value Ref Range Status  10/20/2015 >89 >=60 mL/min Final   GFR calc Af Amer  Date Value Ref Range Status  01/14/2020 90 >59 mL/min/1.73 Final    Comment:    **[facility] currently reports eGFR in compliance with the current**   recommendations of the [employer]. [facility] will   update reporting as new guidelines are published from the NKF-ASN   Task force.    GFR, Est Non African American  Date Value Ref Range Status  10/20/2015 >89 >=60 mL/min Final    Comment:      The estimated GFR is a calculation valid for adults (>=27 years old) that uses the CKD-EPI algorithm to adjust for age and sex. It is   not to be used for children, pregnant women, hospitalized patients,    patients on dialysis, or with rapidly changing kidney function. According to the NKDEP, eGFR >89 is normal, 60-89 shows  mild impairment, 30-59 shows moderate impairment, 15-29 shows severe impairment and <15 is ESRD.      GFR calc non Af Amer  Date Value Ref Range Status  01/14/2020 78 >59 mL/min/1.73 Final         Passed - Valid encounter within last 6 months    Recent Outpatient Visits          5 months ago Encounter for Papanicolaou smear for cervical cancer screening   [place], [place] W, NP   6 months ago Type 2 diabetes mellitus with hyperglycemia, without long-term current use of insulin [facility])   [place], [place] W, NP   1 year ago Type 2 diabetes mellitus with hyperglycemia, without long-term current use of insulin [facility])   [place], Vernia Buff, NP   1 year ago COVID-19 virus infection   [place] Elsie Stain, MD   1 year ago COVID-19 virus infection  [place] Elsie Stain, MD      Future Appointments            In 1 month [name], NP [place] with interpreter Sharee Pimple [phone] and scheduled 6 month follow up for medication refills.

## 2020-05-19 ENCOUNTER — Other Ambulatory Visit: Payer: Self-pay | Admitting: Nurse Practitioner

## 2020-05-19 DIAGNOSIS — I10 Essential (primary) hypertension: Secondary | ICD-10-CM

## 2020-05-19 MED FILL — metFORMIN HCL ER 500 MG TB2: 500 | 15 days supply | Qty: 60 | Fill #1

## 2020-05-19 MED FILL — GLIMEPIRIDE 2 MG TABS: 2 | 30 days supply | Qty: 30 | Fill #1

## 2020-05-19 MED FILL — DILTIAZEM 24HR ER 180 MG CA: 180 | 90 days supply | Qty: 90 | Fill #0

## 2020-05-19 MED FILL — ATORVASTATIN CALCIUM 40 MG: 40 | 30 days supply | Qty: 30 | Fill #5

## 2020-05-20 ENCOUNTER — Other Ambulatory Visit: Payer: Self-pay | Admitting: Nurse Practitioner

## 2020-05-20 DIAGNOSIS — I10 Essential (primary) hypertension: Secondary | ICD-10-CM

## 2020-05-20 MED FILL — LISINOPRIL 40 MG TABLET: 40 | 30 days supply | Qty: 30 | Fill #0

## 2020-05-21 MED FILL — DILTIAZEM 24HR ER 180 MG CA: 180 | 10 days supply | Qty: 10 | Fill #0

## 2020-05-27 ENCOUNTER — Encounter: Payer: Self-pay | Admitting: Nurse Practitioner

## 2020-05-27 ENCOUNTER — Other Ambulatory Visit: Payer: Self-pay

## 2020-05-27 ENCOUNTER — Other Ambulatory Visit: Payer: Self-pay | Admitting: Nurse Practitioner

## 2020-05-27 ENCOUNTER — Ambulatory Visit: Payer: Self-pay | Attending: Nurse Practitioner | Admitting: Nurse Practitioner

## 2020-05-27 DIAGNOSIS — E1165 Type 2 diabetes mellitus with hyperglycemia: Secondary | ICD-10-CM

## 2020-05-27 DIAGNOSIS — I10 Essential (primary) hypertension: Secondary | ICD-10-CM

## 2020-05-27 DIAGNOSIS — E785 Hyperlipidemia, unspecified: Secondary | ICD-10-CM

## 2020-05-27 DIAGNOSIS — E1169 Type 2 diabetes mellitus with other specified complication: Secondary | ICD-10-CM

## 2020-05-27 MED ORDER — GLIMEPIRIDE 2 MG PO TABS: 2.0000 mg | ORAL_TABLET | Freq: Every day | ORAL | 1 refills | Status: DC

## 2020-05-27 MED ORDER — METFORMIN HCL ER 500 MG PO TB24: ORAL_TABLET | ORAL | 0 refills | Status: DC

## 2020-05-27 MED ORDER — GLUCOSE BLOOD VI STRP: ORAL_STRIP | 12 refills | Status: DC

## 2020-05-27 MED ORDER — DILTIAZEM HCL ER COATED BEADS 180 MG PO CP24: 180.0000 mg | ORAL_CAPSULE | Freq: Every day | ORAL | 1 refills | Status: DC

## 2020-05-27 MED ORDER — TRUEPLUS LANCETS 28G MISC: 1.0000 | Freq: Two times a day (BID) | 12 refills | Status: DC

## 2020-05-27 MED ORDER — ATORVASTATIN CALCIUM 40 MG PO TABS: 40.0000 mg | ORAL_TABLET | Freq: Every day | ORAL | 3 refills | Status: DC

## 2020-05-27 MED ORDER — LISINOPRIL 40 MG PO TABS: 40.0000 mg | ORAL_TABLET | Freq: Every day | ORAL | 1 refills | Status: DC

## 2020-05-27 MED FILL — TRUE METRIX TEST STRIP: 50 days supply | Qty: 100 | Fill #0

## 2020-05-27 MED FILL — TRUEplus LANCETS 28G MISC: 50 days supply | Qty: 100 | Fill #0

## 2020-05-27 NOTE — Progress Notes (Signed)
Virtual Visit via Telephone Note Due to national recommendations of social distancing due to COVID 19, telehealth visit is felt to be most appropriate for this patient at this time.  I discussed the limitations, risks, security and privacy concerns of performing an evaluation and management service by telephone and the availability of in person appointments. I also discussed with the patient that there may be a patient responsible charge related to this service. The patient expressed understanding and agreed to proceed.    I connected with Reham Slabaugh on 05/27/20  at   3:30 PM EST  EDT by telephone and verified that I am speaking with the correct person using two identifiers.   Consent I discussed the limitations, risks, security and privacy concerns of performing an evaluation and management service by telephone and the availability of in person appointments. I also discussed with the patient that there may be a patient responsible charge related to this service. The patient expressed understanding and agreed to proceed.   Location of Patient: Private  Residence    Location of Provider: [facility] [employer] [facility]    Persons participating in Telemedicine visit: Bertram Denver FNP-BC [place] CMA [address] [place] Spanish Interpeter ID# [id number]   History of Present Illness: Telemedicine visit for: Follow Up  has a past medical history of COVID-19 virus infection (12/12/2018), Diabetes mellitus without complication (HCC) (2009), Hearing deficit, Hyperlipidemia, and Hypertension (2008).  DM2 She monitors her blood glucose every morning with this morning's reading 122. Average fasting readings 98-120s. Taking metformin 1000 mg BID and amaryl 2 mg daily. Denies any symptoms of hypo or hyperglycemia. Taking ACE and statin. LDL at goal.  Lab Results  Component Value Date   HGBA1C 6.8 (H) 05/28/2020   Lab Results  Component Value Date   LDLCALC 45  10/01/2019   Blood pressure is well controlled. She is taking cardizem 180 mg daly and lisinopril 40 mg daily BP Readings from Last 3 Encounters:  11/22/19 138/88  11/05/19 122/81  10/01/19 130/83   Past Medical History:  Diagnosis Date   COVID-19 virus infection 12/12/2018   Diabetes mellitus without complication (HCC) 2009   Hearing deficit    Hyperlipidemia    Hypertension 2008    History reviewed. No pertinent surgical history.  Family History  Problem Relation Age of Onset   Diabetes Mother    Hypertension Mother    Diabetes Father    Hypertension Father    Cancer Neg Hx    Heart disease Neg Hx     Social History   Socioeconomic History   Marital status: Married    Spouse name: Charlcie Cradle    Number of children: 3    Years of education: 8 th    Highest education level: Not on file  Occupational History   Occupation: Unemployed   Tobacco Use   Smoking status: Never Smoker   Smokeless tobacco: Never Used  Building services engineer Use: Never used  Substance and Sexual Activity   Alcohol use: No   Drug use: No   Sexual activity: Yes    Birth control/protection: I.U.D.  Other Topics Concern   Not on file  Social History Narrative   Lives at home with husband.    Two children live at home.   Oldest son, lives out of state in [place].    Social Determinants of Health   Financial Resource Strain: Not on file  Food Insecurity: Not on file  Transportation  Needs: Unmet Transportation Needs   Lack of Transportation (Medical): Yes   Lack of Transportation (Non-Medical): Yes  Physical Activity: Not on file  Stress: Not on file  Social Connections: Not on file     Observations/Objective: Awake, alert and oriented x 3   Review of Systems  Constitutional: Negative for fever, malaise/fatigue and weight loss.  HENT: Positive for hearing loss. Negative for nosebleeds.   Eyes: Negative.  Negative for blurred vision, double vision and  photophobia.  Respiratory: Negative.  Negative for cough and shortness of breath.   Cardiovascular: Negative.  Negative for chest pain, palpitations and leg swelling.  Gastrointestinal: Negative.  Negative for heartburn, nausea and vomiting.  Musculoskeletal: Negative.  Negative for myalgias.  Neurological: Negative.  Negative for dizziness, focal weakness, seizures and headaches.  Psychiatric/Behavioral: Negative.  Negative for suicidal ideas.    Assessment and Plan: Rekha was seen today for medication refill.  Diagnoses and all orders for this visit:  Type 2 diabetes mellitus with hyperglycemia, without long-term current use of insulin (HCC) -     TRUEplus Lancets 28G MISC; 1 each by Does not apply route 2 (two) times daily. -     metFORMIN (GLUCOPHAGE-XR) 500 MG 24 hr tablet; TAKE 2 TABLETS TWICE A DAY AT MEALS -     glucose blood test strip; USE AS DIRECTED -     glimepiride (AMARYL) 2 MG tablet; Take 1 tablet (2 mg total) by mouth daily with breakfast. Continue blood sugar control as discussed in office today, low carbohydrate diet, and regular physical exercise as tolerated, 150 minutes per week (30 min each day, 5 days per week, or 50 min 3 days per week). Keep blood sugar logs with fasting goal of 90-130 mg/dl, post prandial (after you eat) less than 180.  For Hypoglycemia: BS <60 and Hyperglycemia BS >400; contact the clinic ASAP. Annual eye exams and foot exams are recommended.   Essential hypertension -     lisinopril (ZESTRIL) 40 MG tablet; Take 1 tablet (40 mg total) by mouth daily. -     diltiazem (CARDIZEM CD) 180 MG 24 hr capsule; Take 1 capsule (180 mg total) by mouth daily. Continue all antihypertensives as prescribed.  Remember to bring in your blood pressure log with you for your follow up appointment.  DASH/Mediterranean Diets are healthier choices for HTN.    Hyperlipidemia associated with type 2 diabetes mellitus (HCC) -     atorvastatin (LIPITOR) 40 MG  tablet; Take 1 tablet (40 mg total) by mouth daily at 6 PM. INSTRUCTIONS: Work on a low fat, heart healthy diet and participate in regular aerobic exercise program by working out at least 150 minutes per week; 5 days a week-30 minutes per day. Avoid red meat/beef/steak,  fried foods. junk foods, sodas, sugary drinks, unhealthy snacking, alcohol and smoking.  Drink at least 80 oz of water per day and monitor your carbohydrate intake daily.     Follow Up Instructions Return in about 3 months (around 08/25/2020).     I discussed the assessment and treatment plan with the patient. The patient was provided an opportunity to ask questions and all were answered. The patient agreed with the plan and demonstrated an understanding of the instructions.   The patient was advised to call back or seek an in-person evaluation if the symptoms worsen or if the condition fails to improve as anticipated.  I provided 14 minutes of non-face-to-face time during this encounter including median intraservice time, reviewing previous notes, labs,  imaging, medications and explaining diagnosis and management.  Claiborne Rigg, FNP-BC

## 2020-05-28 ENCOUNTER — Other Ambulatory Visit: Payer: Self-pay | Admitting: Nurse Practitioner

## 2020-05-28 ENCOUNTER — Other Ambulatory Visit: Payer: Self-pay

## 2020-05-28 ENCOUNTER — Ambulatory Visit: Payer: No Typology Code available for payment source | Attending: Nurse Practitioner

## 2020-05-28 DIAGNOSIS — E1165 Type 2 diabetes mellitus with hyperglycemia: Secondary | ICD-10-CM

## 2020-05-28 DIAGNOSIS — Z1159 Encounter for screening for other viral diseases: Secondary | ICD-10-CM

## 2020-05-28 DIAGNOSIS — I10 Essential (primary) hypertension: Secondary | ICD-10-CM

## 2020-05-29 LAB — CMP14+EGFR
ALT: 22 IU/L (ref 0–32)
AST: 13 IU/L (ref 0–40)
Albumin/Globulin Ratio: 1.4 (ref 1.2–2.2)
Albumin: 3.8 g/dL (ref 3.8–4.8)
Alkaline Phosphatase: 103 IU/L (ref 44–121)
BUN/Creatinine Ratio: 23 (ref 9–23)
BUN: 15 mg/dL (ref 6–24)
Bilirubin Total: 0.3 mg/dL (ref 0.0–1.2)
CO2: 22 mmol/L (ref 20–29)
Calcium: 8.7 mg/dL (ref 8.7–10.2)
Chloride: 103 mmol/L (ref 96–106)
Creatinine, Ser: 0.66 mg/dL (ref 0.57–1.00)
GFR calc Af Amer: 120 mL/min/{1.73_m2} (ref >59)
GFR calc non Af Amer: 104 mL/min/{1.73_m2} (ref >59)
Globulin, Total: 2.7 g/dL (ref 1.5–4.5)
Glucose: 150 mg/dL — ABNORMAL HIGH (ref 65–99)
Potassium: 4.2 mmol/L (ref 3.5–5.2)
Sodium: 137 mmol/L (ref 134–144)
Total Protein: 6.5 g/dL (ref 6.0–8.5)

## 2020-05-29 LAB — HEMOGLOBIN A1C
Est. average glucose Bld gHb Est-mCnc: 148 mg/dL
Hgb A1c MFr Bld: 6.8 % — ABNORMAL HIGH (ref 4.8–5.6)

## 2020-05-29 LAB — HCV AB W REFLEX TO QUANT PCR: HCV Ab: <0.1 s/co ratio (ref 0.0–0.9)

## 2020-05-29 LAB — HCV INTERPRETATION

## 2020-06-08 ENCOUNTER — Encounter: Payer: Self-pay | Admitting: Nurse Practitioner

## 2020-06-17 MED FILL — GLIMEPIRIDE 2 MG TABS: 2 | 30 days supply | Qty: 30 | Fill #2

## 2020-06-17 MED FILL — LISINOPRIL 40 MG TABLET: 40 | 30 days supply | Qty: 30 | Fill #0

## 2020-06-17 MED FILL — metFORMIN HCL ER 500 MG TB2: 500 | 30 days supply | Qty: 120 | Fill #0

## 2020-06-17 MED FILL — ATORVASTATIN CALCIUM 40 MG: 40 | 30 days supply | Qty: 30 | Fill #6

## 2020-07-24 MED FILL — metFORMIN HCL ER 500 MG TB2: 500 | 15 days supply | Qty: 60 | Fill #1

## 2020-07-24 MED FILL — LISINOPRIL 40 MG TABLET: 40 | 30 days supply | Qty: 30 | Fill #1

## 2020-07-24 MED FILL — GLIMEPIRIDE 2 MG TABS: 2 | 30 days supply | Qty: 30 | Fill #0

## 2020-07-24 MED FILL — ATORVASTATIN CALCIUM 40 MG: 40 | 30 days supply | Qty: 30 | Fill #7

## 2020-08-19 ENCOUNTER — Other Ambulatory Visit: Payer: Self-pay | Admitting: Nurse Practitioner

## 2020-08-19 DIAGNOSIS — E1165 Type 2 diabetes mellitus with hyperglycemia: Secondary | ICD-10-CM

## 2020-08-19 MED FILL — LISINOPRIL 40 MG TAB: 40 | 30 days supply | Qty: 30 | Fill #2

## 2020-08-19 MED FILL — GLIMEPIRIDE 2 MG TABS: 2 | 30 days supply | Qty: 30 | Fill #1

## 2020-08-19 MED FILL — ATORVASTATIN CALCIUM 40 MG: 40 | 30 days supply | Qty: 30 | Fill #8

## 2020-08-19 MED FILL — DILTIAZEM 24HR ER 180 MG CA: 180 | 30 days supply | Qty: 30 | Fill #0

## 2020-08-19 MED FILL — metFORMIN HCL ER 500 MG TB2: 500 | 30 days supply | Qty: 120 | Fill #0

## 2020-08-20 ENCOUNTER — Telehealth: Payer: Self-pay | Admitting: Nurse Practitioner

## 2020-08-20 NOTE — Telephone Encounter (Signed)
Provider will be out the off 3/15 but working virtual. Called Pt with interpreter no answer. Vm left that appt is virtual but if rather be in person to call [phone] to reschedule appt.

## 2020-08-26 ENCOUNTER — Telehealth: Payer: Self-pay | Admitting: Nurse Practitioner

## 2020-08-26 ENCOUNTER — Ambulatory Visit: Payer: Self-pay | Attending: Nurse Practitioner | Admitting: Nurse Practitioner

## 2020-08-26 ENCOUNTER — Other Ambulatory Visit: Payer: Self-pay

## 2020-08-26 NOTE — Telephone Encounter (Signed)
LVM to call office for telephone visit today. Used spanish interpreter

## 2020-09-12 ENCOUNTER — Telehealth: Payer: No Typology Code available for payment source | Admitting: Nurse Practitioner

## 2020-09-13 ENCOUNTER — Other Ambulatory Visit: Payer: Self-pay

## 2020-09-15 MED FILL — Lisinopril Tab 40 MG: ORAL | 30 days supply | Qty: 30 | Fill #0 | Status: AC

## 2020-09-15 MED FILL — Diltiazem HCl Coated Beads Cap ER 24HR 180 MG: ORAL | 30 days supply | Qty: 30 | Fill #0 | Status: AC

## 2020-09-15 MED FILL — Glimepiride Tab 2 MG: ORAL | 30 days supply | Qty: 30 | Fill #0 | Status: AC

## 2020-09-15 MED FILL — Atorvastatin Calcium Tab 40 MG (Base Equivalent): ORAL | 30 days supply | Qty: 30 | Fill #0 | Status: AC

## 2020-09-15 MED FILL — Metformin HCl Tab ER 24HR 500 MG: ORAL | 30 days supply | Qty: 120 | Fill #0 | Status: AC

## 2020-09-16 ENCOUNTER — Other Ambulatory Visit: Payer: Self-pay

## 2020-09-19 ENCOUNTER — Other Ambulatory Visit: Payer: Self-pay

## 2020-10-14 MED FILL — Lisinopril Tab 40 MG: ORAL | 30 days supply | Qty: 30 | Fill #1 | Status: AC

## 2020-10-14 MED FILL — Glimepiride Tab 2 MG: ORAL | 30 days supply | Qty: 30 | Fill #1 | Status: CN

## 2020-10-14 MED FILL — Diltiazem HCl Coated Beads Cap ER 24HR 180 MG: ORAL | 30 days supply | Qty: 30 | Fill #1 | Status: AC

## 2020-10-15 ENCOUNTER — Ambulatory Visit: Payer: Self-pay | Attending: Nurse Practitioner | Admitting: Physician Assistant

## 2020-10-15 ENCOUNTER — Other Ambulatory Visit: Payer: Self-pay

## 2020-10-15 ENCOUNTER — Encounter: Payer: Self-pay | Admitting: Physician Assistant

## 2020-10-15 VITALS — BP 155/93 | HR 80 | Resp 18 | Ht 65.0 in | Wt 185.4 lb

## 2020-10-15 DIAGNOSIS — Z789 Other specified health status: Secondary | ICD-10-CM

## 2020-10-15 DIAGNOSIS — I10 Essential (primary) hypertension: Secondary | ICD-10-CM

## 2020-10-15 DIAGNOSIS — E785 Hyperlipidemia, unspecified: Secondary | ICD-10-CM

## 2020-10-15 DIAGNOSIS — M2632 Excessive spacing of fully erupted teeth: Secondary | ICD-10-CM

## 2020-10-15 DIAGNOSIS — E1169 Type 2 diabetes mellitus with other specified complication: Secondary | ICD-10-CM

## 2020-10-15 DIAGNOSIS — Z974 Presence of external hearing-aid: Secondary | ICD-10-CM

## 2020-10-15 DIAGNOSIS — E1165 Type 2 diabetes mellitus with hyperglycemia: Secondary | ICD-10-CM

## 2020-10-15 LAB — POCT GLYCOSYLATED HEMOGLOBIN (HGB A1C): HbA1c, POC (controlled diabetic range): 6.7 % (ref 0.0–7.0)

## 2020-10-15 LAB — GLUCOSE, POCT (MANUAL RESULT ENTRY): POC Glucose: 99 mg/dl (ref 70–99)

## 2020-10-15 MED ORDER — HYDROCHLOROTHIAZIDE 12.5 MG PO CAPS
12.5000 mg | ORAL_CAPSULE | Freq: Every day | ORAL | 3 refills | Status: DC
  Filled 2020-10-15: qty 30, 30d supply, fill #0
  Filled 2020-11-12: qty 30, 30d supply, fill #1
  Filled 2020-12-12: qty 30, 30d supply, fill #2
  Filled 2021-01-13: qty 30, 30d supply, fill #3
  Filled 2021-02-09: qty 30, 30d supply, fill #4
  Filled 2021-03-10: qty 30, 30d supply, fill #5
  Filled 2021-04-09: qty 30, 30d supply, fill #6
  Filled 2021-05-11: qty 30, 30d supply, fill #7
  Filled 2021-05-24: qty 30, 30d supply, fill #8
  Filled 2021-07-06: qty 30, 30d supply, fill #9
  Filled 2021-07-07: qty 30, 30d supply, fill #0
  Filled 2021-08-08: qty 30, 30d supply, fill #1
  Filled 2021-09-08: qty 30, 30d supply, fill #2

## 2020-10-15 MED ORDER — LISINOPRIL 40 MG PO TABS
ORAL_TABLET | Freq: Every day | ORAL | 1 refills | Status: DC
  Filled 2020-11-14: qty 30, 30d supply, fill #0
  Filled 2020-12-12: qty 30, 30d supply, fill #1
  Filled 2021-01-13: qty 30, 30d supply, fill #2
  Filled 2021-02-09: qty 30, 30d supply, fill #3
  Filled 2021-03-10: qty 30, 30d supply, fill #4
  Filled 2021-04-09: qty 30, 30d supply, fill #5

## 2020-10-15 MED ORDER — DILTIAZEM HCL ER COATED BEADS 180 MG PO CP24
ORAL_CAPSULE | ORAL | 1 refills | Status: DC
  Filled 2020-11-14: qty 30, 30d supply, fill #0
  Filled 2020-12-12: qty 30, 30d supply, fill #1
  Filled 2021-01-13: qty 30, 30d supply, fill #2
  Filled 2021-02-09: qty 30, 30d supply, fill #3
  Filled 2021-03-10: qty 30, 30d supply, fill #4
  Filled 2021-04-09: qty 30, 30d supply, fill #5

## 2020-10-15 MED ORDER — GLIMEPIRIDE 4 MG PO TABS
4.0000 mg | ORAL_TABLET | Freq: Every day | ORAL | 3 refills | Status: DC
  Filled 2020-10-15: qty 30, 30d supply, fill #0
  Filled 2020-11-12: qty 30, 30d supply, fill #1
  Filled 2020-12-12: qty 30, 30d supply, fill #2
  Filled 2021-01-13: qty 30, 30d supply, fill #3

## 2020-10-15 MED ORDER — METFORMIN HCL ER 500 MG PO TB24
ORAL_TABLET | ORAL | 1 refills | Status: DC
  Filled 2020-10-15: qty 120, 30d supply, fill #0
  Filled 2020-11-14: qty 120, 30d supply, fill #1
  Filled 2020-12-12: qty 120, 30d supply, fill #2

## 2020-10-15 MED ORDER — ATORVASTATIN CALCIUM 40 MG PO TABS
ORAL_TABLET | Freq: Every day | ORAL | 3 refills | Status: DC
  Filled 2020-10-15: qty 30, 30d supply, fill #0
  Filled 2020-11-14: qty 30, 30d supply, fill #1
  Filled 2020-12-12: qty 30, 30d supply, fill #2
  Filled 2021-01-13: qty 30, 30d supply, fill #3
  Filled 2021-02-09 (×2): qty 30, 30d supply, fill #4
  Filled 2021-03-10: qty 30, 30d supply, fill #5
  Filled 2021-04-09: qty 30, 30d supply, fill #6
  Filled 2021-05-11: qty 30, 30d supply, fill #7
  Filled 2021-05-24: qty 30, 30d supply, fill #8
  Filled 2021-07-06: qty 30, 30d supply, fill #9
  Filled 2021-07-07: qty 30, 30d supply, fill #0
  Filled 2021-08-08: qty 30, 30d supply, fill #1
  Filled 2021-09-08 – 2021-09-09 (×2): qty 30, 30d supply, fill #2

## 2020-10-15 NOTE — Progress Notes (Signed)
Isabella Joyce, is a 50 y.o. female  KDX:[id number]  ZJQ:[id number]  DOB - 08/13/1970  Subjective:  Chief Complaint and HPI: Isabella Joyce is a 50 y.o. female here today for med RF.  Not checking glucose.  Compliant with meds.  Not compliant with diabetic diet.  Also concerned bc one of her hearing aids is broken and feels her front teeth are becoming wider apart  No other new issues or concerns today.     ROS:   Constitutional:  No f/c, No night sweats, No unexplained weight loss. EENT:  No vision changes, No blurry vision, No hearing changes. No other mouth, throat, or ear problems.  Respiratory: No cough, No SOB Cardiac: No CP, no palpitations GI:  No abd pain, No N/V/D. GU: No Urinary s/sx Musculoskeletal: No joint pain Neuro: No headache, no dizziness, no motor weakness.  Skin: No rash Endocrine:  No polydipsia. No polyuria.  Psych: Denies SI/HI  No problems updated.  ALLERGIES: Allergies  Allergen Reactions  . Pollen Extract     Hear swelling    PAST MEDICAL HISTORY: Past Medical History:  Diagnosis Date  . COVID-19 virus infection 12/12/2018  . Diabetes mellitus without complication ([place]) 2409  . Hearing deficit   . Hyperlipidemia   . Hypertension 2008    MEDICATIONS AT HOME: Prior to Admission medications   Medication Sig Start Date End Date Taking? Authorizing Provider  atorvastatin (LIPITOR) 40 MG tablet Take 1 tablet (40 mg total) by mouth daily at 6 PM. 05/27/20  Yes [name], NP  Blood Glucose Monitoring Suppl (TRUE METRIX METER) w/Device KIT USE AS DIRECTED 2 TIMES DAILY 01/17/20  Yes [name], NP  glimepiride (AMARYL) 4 MG tablet Take 1 tablet (4 mg total) by mouth daily before breakfast. 10/15/20  Yes Argentina Donovan, PA-C  glucose blood test strip USE AS DIRECTED 05/27/20  Yes [name], NP  hydrochlorothiazide (MICROZIDE) 12.5 MG capsule Take 1 capsule (12.5 mg total) by mouth daily. 10/15/20  Yes Ethanjames Fontenot,  Levada Dy M, PA-C  TRUEplus Lancets 28G MISC USE AS DIRECTED 2 TIMES DAILY 05/27/20 05/27/21 Yes [name], NP  atorvastatin (LIPITOR) 40 MG tablet TAKE 1 TABLET (40 MG TOTAL) BY MOUTH DAILY AT 6 PM. 10/15/20 10/15/21  Argentina Donovan, PA-C  diltiazem (CARDIZEM CD) 180 MG 24 hr capsule TAKE 1 CAPSULE (180 MG TOTAL) BY MOUTH DAILY. 10/15/20 10/15/21  Argentina Donovan, PA-C  lisinopril (ZESTRIL) 40 MG tablet TAKE 1 TABLET (40 MG TOTAL) BY MOUTH DAILY. 10/15/20 10/15/21  Argentina Donovan, PA-C  metFORMIN (GLUCOPHAGE-XR) 500 MG 24 hr tablet TAKE 2 TABLETS BY MOUTH TWICE A DAY AT MEALS 10/15/20 10/15/21  Argentina Donovan, PA-C     Objective:  EXAM:   Vitals:   10/15/20 1506  BP: (!) 155/93  Pulse: 80  Resp: 18  SpO2: 97%  Weight: 185 lb 6.4 oz (84.1 kg)  Height: _0  (1.651 m)    General appearance : A&OX3. NAD. Non-toxic-appearing HEENT: Atraumatic and Normocephalic.  PERRLA. EOM intact.  Wide space between front 2 teeth.  Mouth-MMM, post pharynx WNL w/o erythema, No PND. Neck: supple, no JVD. No cervical lymphadenopathy. No thyromegaly Chest/Lungs:  Breathing-non-labored, Good air entry bilaterally, breath sounds normal without rales, rhonchi, or wheezing  CVS: S1 S2 regular, no murmurs, gallops, rubs  Extremities: Bilateral Lower Ext shows no edema, both legs are warm to touch with = pulse throughout Neurology:  CN II-XII grossly intact, Non  focal.   Psych:  TP linear. J/I WNL. Normal speech. Appropriate eye contact and affect.  Skin:  No Rash  Data Review Lab Results  Component Value Date   HGBA1C 6.7 10/15/2020   HGBA1C 6.8 (H) 05/28/2020   HGBA1C 7.2 (H) 01/14/2020     Assessment & Plan   1. Type 2 diabetes mellitus with hyperglycemia, without long-term current use of insulin (HCC) Not at goal.  Increase amaryl.  And work on diabetic diet - Glucose (CBG) - HgB A1c - glimepiride (AMARYL) 4 MG tablet; Take 1 tablet (4 mg total) by mouth daily before breakfast.  Dispense: 30  tablet; Refill: 3 - metFORMIN (GLUCOPHAGE-XR) 500 MG 24 hr tablet; TAKE 2 TABLETS BY MOUTH TWICE A DAY AT MEALS  Dispense: 180 tablet; Refill: 1  2. Essential hypertension Uncontrolled.  Add HCT - lisinopril (ZESTRIL) 40 MG tablet; TAKE 1 TABLET (40 MG TOTAL) BY MOUTH DAILY.  Dispense: 90 tablet; Refill: 1 - diltiazem (CARDIZEM CD) 180 MG 24 hr capsule; TAKE 1 CAPSULE (180 MG TOTAL) BY MOUTH DAILY.  Dispense: 90 capsule; Refill: 1 - hydrochlorothiazide (MICROZIDE) 12.5 MG capsule; Take 1 capsule (12.5 mg total) by mouth daily.  Dispense: 90 capsule; Refill: 3  3. Hyperlipidemia associated with type 2 diabetes mellitus (HCC) - atorvastatin (LIPITOR) 40 MG tablet; TAKE 1 TABLET (40 MG TOTAL) BY MOUTH DAILY AT 6 PM.  Dispense: 90 tablet; Refill: 3  4. Hearing aid worn - Ambulatory referral to ENT  5. Abnormal spacing of teeth - Ambulatory referral to Dentistry  6. Language barrier [employer] interpreters used and additional time performing visit was required.   Patient have been counseled extensively about nutrition and exercise  Return in about 3 months (around 01/15/2021) for PCP;  chronic conditions.  The patient was given clear instructions to go to ER or return to medical center if symptoms don't improve, worsen or new problems develop. The patient verbalized understanding. The patient was told to call to get lab results if they haven't heard anything in the next week.     Freeman Caldron, PA-C [facility] and [place], [place]   10/15/2020, 3:46 PMPatient ID: Isabella Joyce, female   DOB: [birth year], 51 y.o.   MRN: [id number]

## 2020-10-16 ENCOUNTER — Other Ambulatory Visit: Payer: Self-pay

## 2020-11-13 ENCOUNTER — Other Ambulatory Visit: Payer: Self-pay

## 2020-11-14 ENCOUNTER — Other Ambulatory Visit: Payer: Self-pay

## 2020-11-14 ENCOUNTER — Ambulatory Visit: Payer: Self-pay | Attending: Nurse Practitioner

## 2020-11-17 ENCOUNTER — Other Ambulatory Visit: Payer: Self-pay

## 2020-11-18 ENCOUNTER — Ambulatory Visit: Payer: No Typology Code available for payment source | Admitting: Nurse Practitioner

## 2020-11-25 ENCOUNTER — Telehealth: Payer: Self-pay | Admitting: Nurse Practitioner

## 2020-11-25 NOTE — Telephone Encounter (Signed)
Pt was sent a letter from financial dept. Inform them, that the application they submitted was incomplete, since they were missing some documentation at the time of the appointment, Pt need to reschedule and resubmit all new papers and application for CAFA and OC, P.S. old documents has been sent back by mail to the Pt and Pt. need to make a new appt. 

## 2020-12-16 ENCOUNTER — Other Ambulatory Visit: Payer: Self-pay

## 2020-12-17 ENCOUNTER — Other Ambulatory Visit: Payer: Self-pay

## 2021-01-13 ENCOUNTER — Other Ambulatory Visit: Payer: Self-pay

## 2021-01-13 ENCOUNTER — Other Ambulatory Visit: Payer: Self-pay | Admitting: Physician Assistant

## 2021-01-13 DIAGNOSIS — E1165 Type 2 diabetes mellitus with hyperglycemia: Secondary | ICD-10-CM

## 2021-01-13 NOTE — Telephone Encounter (Signed)
last RF 10/15/20 #180 1 RF

## 2021-01-15 ENCOUNTER — Other Ambulatory Visit: Payer: Self-pay

## 2021-01-16 ENCOUNTER — Encounter: Payer: Self-pay | Admitting: Nurse Practitioner

## 2021-01-16 ENCOUNTER — Ambulatory Visit: Payer: Self-pay | Attending: Nurse Practitioner | Admitting: Nurse Practitioner

## 2021-01-16 ENCOUNTER — Other Ambulatory Visit: Payer: Self-pay

## 2021-01-16 VITALS — BP 129/77 | HR 74 | Ht 65.0 in | Wt 187.0 lb

## 2021-01-16 DIAGNOSIS — E785 Hyperlipidemia, unspecified: Secondary | ICD-10-CM

## 2021-01-16 DIAGNOSIS — Z1211 Encounter for screening for malignant neoplasm of colon: Secondary | ICD-10-CM

## 2021-01-16 DIAGNOSIS — I10 Essential (primary) hypertension: Secondary | ICD-10-CM

## 2021-01-16 DIAGNOSIS — Z130 Encounter for screening for diseases of the blood and blood-forming organs and certain disorders involving the immune mechanism: Secondary | ICD-10-CM

## 2021-01-16 DIAGNOSIS — E1165 Type 2 diabetes mellitus with hyperglycemia: Secondary | ICD-10-CM

## 2021-01-16 NOTE — Progress Notes (Signed)
Assessment & Plan:  Isabella Joyce was seen today for diabetes.  Diagnoses and all orders for this visit:  Type 2 diabetes mellitus with hyperglycemia, without long-term current use of insulin (HCC) -     Hemoglobin A1c  Primary hypertension -     CMP14+EGFR  Dyslipidemia, goal LDL below 100 -     Lipid panel  Screening for deficiency anemia -     CBC  Colon cancer screening -     Fecal occult blood, imunochemical([facility])   Patient has been counseled on age-appropriate routine health concerns for screening and prevention. These are reviewed and up-to-date. Referrals have been placed accordingly. Immunizations are up-to-date or declined.    Subjective:   Chief Complaint  Patient presents with   Diabetes   Diabetes Pertinent negatives for hypoglycemia include no dizziness, headaches or seizures. Pertinent negatives for diabetes include no blurred vision, no chest pain and no weight loss.  Isabella Joyce 50 y.o. female presents to office today for follow up. She has a past medical history of COVID-19 virus infection (12/12/2018), Diabetes mellitus without complication ([place]) (4580), Hearing deficit, Hyperlipidemia, and Hypertension (2008 VRI was used to communicate directly with patient for the entire encounter including providing detailed patient instructions.    Patient has been counseled on age-appropriate routine health concerns for screening and prevention. These are reviewed and up-to-date. Referrals have been placed accordingly. Immunizations are up-to-date or declined.     Mammogram: Needs to schedule   DM 2 She is monitoring her blood glucose levels at home but no every day. 100-140. Diabetes is well controlled. Taking metformin XR 1000 mg BID and amaryl 4 mg daily. Cholesterol levels well controlled with atorvastatin 40 mg daily.  Lab Results  Component Value Date   HGBA1C 6.7 10/15/2020   Lab Results  Component Value Date   LDLCALC 45 10/01/2019      HTN Well controlled. Taking diltiazem 180 mg daily,  HCTZ 12.5 mg daily and lisinopril 40 mg daily as prescribed. Denies chest pain, shortness of breath, palpitations, lightheadedness, dizziness, headaches or BLE edema.   BP Readings from Last 3 Encounters:  01/16/21 129/77  10/15/20 (!) 155/93  11/22/19 138/88     Review of Systems  Constitutional:  Negative for fever, malaise/fatigue and weight loss.  HENT: Negative.  Negative for nosebleeds.   Eyes: Negative.  Negative for blurred vision, double vision and photophobia.  Respiratory: Negative.  Negative for cough and shortness of breath.   Cardiovascular: Negative.  Negative for chest pain, palpitations and leg swelling.  Gastrointestinal: Negative.  Negative for heartburn, nausea and vomiting.  Musculoskeletal: Negative.  Negative for myalgias.  Neurological: Negative.  Negative for dizziness, focal weakness, seizures and headaches.  Psychiatric/Behavioral: Negative.  Negative for suicidal ideas.    Past Medical History:  Diagnosis Date   COVID-19 virus infection 12/12/2018   Diabetes mellitus without complication ([place]) 9983   Hearing deficit    Hyperlipidemia    Hypertension 2008    No past surgical history on file.  Family History  Problem Relation Age of Onset   Diabetes Mother    Hypertension Mother    Diabetes Father    Hypertension Father    Cancer Neg Hx    Heart disease Neg Hx     Social History Reviewed with no changes to be made today.   Outpatient Medications Prior to Visit  Medication Sig Dispense Refill   atorvastatin (LIPITOR) 40 MG tablet TAKE 1 TABLET (40 MG TOTAL) BY MOUTH  DAILY AT 6 PM. 90 tablet 3   Blood Glucose Monitoring Suppl (TRUE METRIX METER) w/Device KIT USE AS DIRECTED 2 TIMES DAILY 1 kit 0   diltiazem (CARDIZEM CD) 180 MG 24 hr capsule TAKE 1 CAPSULE (180 MG TOTAL) BY MOUTH DAILY. 90 capsule 1   glimepiride (AMARYL) 4 MG tablet Take 1 tablet (4 mg total) by mouth daily before  breakfast. 30 tablet 3   glucose blood test strip USE AS DIRECTED 100 each 12   hydrochlorothiazide (MICROZIDE) 12.5 MG capsule Take 1 capsule (12.5 mg total) by mouth daily. 90 capsule 3   lisinopril (ZESTRIL) 40 MG tablet TAKE 1 TABLET (40 MG TOTAL) BY MOUTH DAILY. 90 tablet 1   metFORMIN (GLUCOPHAGE-XR) 500 MG 24 hr tablet TAKE 2 TABLETS BY MOUTH TWICE A DAY AT MEALS 180 tablet 1   TRUEplus Lancets 28G MISC USE AS DIRECTED 2 TIMES DAILY 100 each 12   atorvastatin (LIPITOR) 40 MG tablet Take 1 tablet (40 mg total) by mouth daily at 6 PM. 90 tablet 3   No facility-administered medications prior to visit.    Allergies  Allergen Reactions   Pollen Extract     Hear swelling       Objective:    BP 129/77   Pulse 74   Ht _0  (1.651 m)   Wt 187 lb (84.8 kg)   LMP 01/12/2021 (Approximate)   SpO2 99%   BMI 31.12 kg/m  Wt Readings from Last 3 Encounters:  01/16/21 187 lb (84.8 kg)  10/15/20 185 lb 6.4 oz (84.1 kg)  11/22/19 186 lb 4.8 oz (84.5 kg)    Physical Exam Vitals and nursing note reviewed.  Constitutional:      Appearance: She is well-developed.  HENT:     Head: Normocephalic and atraumatic.  Cardiovascular:     Rate and Rhythm: Normal rate and regular rhythm.     Heart sounds: Normal heart sounds. No murmur heard.   No friction rub. No gallop.  Pulmonary:     Effort: Pulmonary effort is normal. No tachypnea or respiratory distress.     Breath sounds: Normal breath sounds. No decreased breath sounds, wheezing, rhonchi or rales.  Chest:     Chest wall: No tenderness.  Abdominal:     General: Bowel sounds are normal.     Palpations: Abdomen is soft.  Musculoskeletal:        General: Normal range of motion.     Cervical back: Normal range of motion.  Skin:    General: Skin is warm and dry.  Neurological:     Mental Status: She is alert and oriented to person, place, and time.     Coordination: Coordination normal.  Psychiatric:        Behavior: Behavior  normal. Behavior is cooperative.        Thought Content: Thought content normal.        Judgment: Judgment normal.         Patient has been counseled extensively about nutrition and exercise as well as the importance of adherence with medications and regular follow-up. The patient was given clear instructions to go to ER or return to medical center if symptoms don't improve, worsen or new problems develop. The patient verbalized understanding.   Follow-up: Return in about 3 months (around 04/18/2021).   [name], FNP-BC [facility] and [place], [place]   01/16/2021, 4:02 PM

## 2021-01-17 LAB — HEMOGLOBIN A1C
Est. average glucose Bld gHb Est-mCnc: 160 mg/dL
Hgb A1c MFr Bld: 7.2 % — ABNORMAL HIGH (ref 4.8–5.6)

## 2021-01-17 LAB — CMP14+EGFR
ALT: 23 IU/L (ref 0–32)
AST: 15 IU/L (ref 0–40)
Albumin/Globulin Ratio: 1.4 (ref 1.2–2.2)
Albumin: 4.1 g/dL (ref 3.8–4.8)
Alkaline Phosphatase: 108 IU/L (ref 44–121)
BUN/Creatinine Ratio: 22 (ref 9–23)
BUN: 15 mg/dL (ref 6–24)
Bilirubin Total: 0.4 mg/dL (ref 0.0–1.2)
CO2: 23 mmol/L (ref 20–29)
Calcium: 8.8 mg/dL (ref 8.7–10.2)
Chloride: 102 mmol/L (ref 96–106)
Creatinine, Ser: 0.69 mg/dL (ref 0.57–1.00)
Globulin, Total: 2.9 g/dL (ref 1.5–4.5)
Glucose: 150 mg/dL — ABNORMAL HIGH (ref 65–99)
Potassium: 3.7 mmol/L (ref 3.5–5.2)
Sodium: 138 mmol/L (ref 134–144)
Total Protein: 7 g/dL (ref 6.0–8.5)
eGFR: 106 mL/min/{1.73_m2} (ref >59)

## 2021-01-17 LAB — CBC
Hematocrit: 40.5 % (ref 34.0–46.6)
Hemoglobin: 13.8 g/dL (ref 11.1–15.9)
MCH: 31.6 pg (ref 26.6–33.0)
MCHC: 34.1 g/dL (ref 31.5–35.7)
MCV: 93 fL (ref 79–97)
Platelets: 238 10*3/uL (ref 150–450)
RBC: 4.37 x10E6/uL (ref 3.77–5.28)
RDW: 13.1 % (ref 11.7–15.4)
WBC: 7.5 10*3/uL (ref 3.4–10.8)

## 2021-01-17 LAB — LIPID PANEL
Chol/HDL Ratio: 2.6 ratio (ref 0.0–4.4)
Cholesterol, Total: 110 mg/dL (ref 100–199)
HDL: 42 mg/dL (ref >39)
LDL Chol Calc (NIH): 47 mg/dL (ref 0–99)
Triglycerides: 117 mg/dL (ref 0–149)
VLDL Cholesterol Cal: 21 mg/dL (ref 5–40)

## 2021-01-19 ENCOUNTER — Ambulatory Visit: Payer: Self-pay | Attending: Nurse Practitioner

## 2021-01-19 ENCOUNTER — Other Ambulatory Visit: Payer: Self-pay

## 2021-01-21 ENCOUNTER — Other Ambulatory Visit: Payer: Self-pay

## 2021-02-09 ENCOUNTER — Other Ambulatory Visit: Payer: Self-pay

## 2021-02-09 ENCOUNTER — Other Ambulatory Visit: Payer: Self-pay | Admitting: Physician Assistant

## 2021-02-09 DIAGNOSIS — E1165 Type 2 diabetes mellitus with hyperglycemia: Secondary | ICD-10-CM

## 2021-02-09 MED ORDER — GLIMEPIRIDE 4 MG PO TABS
4.0000 mg | ORAL_TABLET | Freq: Every day | ORAL | 5 refills | Status: DC
  Filled 2021-02-09: qty 30, 30d supply, fill #0
  Filled 2021-03-10: qty 30, 30d supply, fill #1
  Filled 2021-04-09: qty 30, 30d supply, fill #2
  Filled 2021-05-11: qty 30, 30d supply, fill #3
  Filled 2021-05-24: qty 30, 30d supply, fill #4
  Filled 2021-07-06: qty 30, 30d supply, fill #5
  Filled 2021-07-07: qty 30, 30d supply, fill #0

## 2021-02-09 MED ORDER — METFORMIN HCL ER 500 MG PO TB24
ORAL_TABLET | ORAL | 5 refills | Status: DC
  Filled 2021-02-09: qty 120, 30d supply, fill #0
  Filled 2021-03-10: qty 120, 30d supply, fill #1
  Filled 2021-04-09: qty 120, 30d supply, fill #2
  Filled 2021-05-11: qty 120, 30d supply, fill #3
  Filled 2021-05-24: qty 120, 30d supply, fill #4
  Filled 2021-07-06: qty 120, 30d supply, fill #5
  Filled 2021-07-07: qty 120, 30d supply, fill #0

## 2021-02-09 NOTE — Telephone Encounter (Signed)
Requested Prescriptions  Pending Prescriptions Disp Refills  . glimepiride (AMARYL) 4 MG tablet 30 tablet 5    Sig: Take 1 tablet (4 mg total) by mouth daily before breakfast.     Endocrinology:  Diabetes - Sulfonylureas Passed - 02/09/2021  3:46 PM      Passed - HBA1C is between 0 and 7.9 and within 180 days    HbA1c, POC (prediabetic range)  Date Value Ref Range Status  06/02/2018 6.4 5.7 - 6.4 % Final   HbA1c, POC (controlled diabetic range)  Date Value Ref Range Status  10/15/2020 6.7 0.0 - 7.0 % Final   Hgb A1c MFr Bld  Date Value Ref Range Status  01/16/2021 7.2 (H) 4.8 - 5.6 % Final    Comment:             Prediabetes: 5.7 - 6.4          Diabetes: >6.4          Glycemic control for adults with diabetes: <7.0          Passed - Valid encounter within last 6 months    Recent Outpatient Visits          3 weeks ago Type 2 diabetes mellitus with hyperglycemia, without long-term current use of insulin ([place])   [place], [place] W, NP   3 months ago Type 2 diabetes mellitus with hyperglycemia, without long-term current use of insulin [facility])   [place], [place], [place]   8 months ago Type 2 diabetes mellitus with hyperglycemia, without long-term current use of insulin ([place])   [place], [place] W, NP   1 year ago Encounter for Papanicolaou smear for cervical cancer screening   [place], [place] W, NP   1 year ago Type 2 diabetes mellitus with hyperglycemia, without long-term current use of insulin ([place])   [place], [place] W, NP             . metFORMIN (GLUCOPHAGE-XR) 500 MG 24 hr tablet 120 tablet 5    Sig: TAKE 2 TABLETS BY MOUTH TWICE A DAY AT MEALS     Endocrinology:  Diabetes - Biguanides Passed - 02/09/2021  3:46 PM      Passed - Cr in normal range and within 360 days     Creat  Date Value Ref Range Status  10/20/2015 0.53 0.50 - 1.10 mg/dL Final   Creatinine, Ser  Date Value Ref Range Status  01/16/2021 0.69 0.57 - 1.00 mg/dL Final   Creatinine, Urine  Date Value Ref Range Status  10/20/2015 54 20 - 320 mg/dL Final         Passed - HBA1C is between 0 and 7.9 and within 180 days    HbA1c, POC (prediabetic range)  Date Value Ref Range Status  06/02/2018 6.4 5.7 - 6.4 % Final   HbA1c, POC (controlled diabetic range)  Date Value Ref Range Status  10/15/2020 6.7 0.0 - 7.0 % Final   Hgb A1c MFr Bld  Date Value Ref Range Status  01/16/2021 7.2 (H) 4.8 - 5.6 % Final    Comment:             Prediabetes: 5.7 - 6.4          Diabetes: >6.4          Glycemic control for adults with diabetes: <  7.0          Passed - eGFR in normal range and within 360 days    GFR, Est African American  Date Value Ref Range Status  10/20/2015 >89 >=60 mL/min Final   GFR calc Af Amer  Date Value Ref Range Status  05/28/2020 120 >59 mL/min/1.73 Final    Comment:    **In accordance with recommendations from the NKF-ASN Task force,**   [facility] is in the process of updating its eGFR calculation to the   2021 CKD-EPI creatinine equation that estimates kidney function   without a race variable.    GFR, Est Non African American  Date Value Ref Range Status  10/20/2015 >89 >=60 mL/min Final    Comment:      The estimated GFR is a calculation valid for adults (>=38 years old) that uses the CKD-EPI algorithm to adjust for age and sex. It is   not to be used for children, pregnant women, hospitalized patients,    patients on dialysis, or with rapidly changing kidney function. According to the NKDEP, eGFR >89 is normal, 60-89 shows mild impairment, 30-59 shows moderate impairment, 15-29 shows severe impairment and <15 is ESRD.      GFR calc non Af Amer  Date Value Ref Range Status  05/28/2020 104 >59 mL/min/1.73 Final   eGFR  Date Value Ref Range Status   01/16/2021 106 >59 mL/min/1.73 Final         Passed - Valid encounter within last 6 months    Recent Outpatient Visits          3 weeks ago Type 2 diabetes mellitus with hyperglycemia, without long-term current use of insulin ([place])   [place], [place] W, NP   3 months ago Type 2 diabetes mellitus with hyperglycemia, without long-term current use of insulin [facility])   [place], [place], [place]   8 months ago Type 2 diabetes mellitus with hyperglycemia, without long-term current use of insulin [facility])   [place], Vernia Buff, NP   1 year ago Encounter for Papanicolaou smear for cervical cancer screening   [place], [place] W, NP   1 year ago Type 2 diabetes mellitus with hyperglycemia, without long-term current use of insulin [facility])   [place], Vernia Buff, NP

## 2021-02-10 ENCOUNTER — Other Ambulatory Visit: Payer: Self-pay

## 2021-02-12 ENCOUNTER — Other Ambulatory Visit: Payer: Self-pay

## 2021-03-11 ENCOUNTER — Other Ambulatory Visit: Payer: Self-pay

## 2021-03-31 ENCOUNTER — Other Ambulatory Visit: Payer: Self-pay

## 2021-03-31 ENCOUNTER — Ambulatory Visit: Payer: Self-pay | Attending: Nurse Practitioner

## 2021-03-31 DIAGNOSIS — Z23 Encounter for immunization: Secondary | ICD-10-CM

## 2021-04-10 ENCOUNTER — Other Ambulatory Visit: Payer: Self-pay | Admitting: Obstetrics and Gynecology

## 2021-04-10 ENCOUNTER — Other Ambulatory Visit: Payer: Self-pay

## 2021-04-10 DIAGNOSIS — Z1231 Encounter for screening mammogram for malignant neoplasm of breast: Secondary | ICD-10-CM

## 2021-04-13 ENCOUNTER — Other Ambulatory Visit: Payer: Self-pay

## 2021-05-11 ENCOUNTER — Other Ambulatory Visit: Payer: Self-pay | Admitting: Physician Assistant

## 2021-05-11 ENCOUNTER — Other Ambulatory Visit: Payer: Self-pay

## 2021-05-11 DIAGNOSIS — I10 Essential (primary) hypertension: Secondary | ICD-10-CM

## 2021-05-12 ENCOUNTER — Ambulatory Visit: Payer: Self-pay | Admitting: *Deleted

## 2021-05-12 ENCOUNTER — Other Ambulatory Visit: Payer: Self-pay

## 2021-05-12 ENCOUNTER — Ambulatory Visit
Admission: RE | Admit: 2021-05-12 | Discharge: 2021-05-12 | Disposition: A | Discharge status: Home / Self Care | Payer: No Typology Code available for payment source | Source: Ambulatory Visit | Attending: Obstetrics and Gynecology | Admitting: Obstetrics and Gynecology

## 2021-05-12 VITALS — BP 138/88 | Wt 185.6 lb

## 2021-05-12 DIAGNOSIS — Z1211 Encounter for screening for malignant neoplasm of colon: Secondary | ICD-10-CM

## 2021-05-12 DIAGNOSIS — Z1239 Encounter for other screening for malignant neoplasm of breast: Secondary | ICD-10-CM

## 2021-05-12 DIAGNOSIS — Z1231 Encounter for screening mammogram for malignant neoplasm of breast: Secondary | ICD-10-CM

## 2021-05-12 NOTE — Telephone Encounter (Signed)
Requested medications are due for refill today requesting early  Requested medications are on the active medication list yes  Last refill 04/13/21 FOR 90 DAY supply  Last visit 01/16/21, return in 3 months  Future visit scheduled no  Notes to clinic requesting early, please assess. Requested Prescriptions  Pending Prescriptions Disp Refills   lisinopril (ZESTRIL) 40 MG tablet 90 tablet 1    Sig: TAKE 1 TABLET (40 MG TOTAL) BY MOUTH DAILY.     Cardiovascular:  ACE Inhibitors Passed - 05/11/2021 11:11 AM      Passed - Cr in normal range and within 180 days    Creat  Date Value Ref Range Status  10/20/2015 0.53 0.50 - 1.10 mg/dL Final   Creatinine, Ser  Date Value Ref Range Status  01/16/2021 0.69 0.57 - 1.00 mg/dL Final   Creatinine, Urine  Date Value Ref Range Status  10/20/2015 54 20 - 320 mg/dL Final          Passed - K in normal range and within 180 days    Potassium  Date Value Ref Range Status  01/16/2021 3.7 3.5 - 5.2 mmol/L Final          Passed - Patient is not pregnant      Passed - Last BP in normal range    BP Readings from Last 1 Encounters:  01/16/21 129/77          Passed - Valid encounter within last 6 months    Recent Outpatient Visits           3 months ago Type 2 diabetes mellitus with hyperglycemia, without long-term current use of insulin (HCC)   [place] [facility] [place], [place] W, NP   6 months ago Type 2 diabetes mellitus with hyperglycemia, without long-term current use of insulin [facility])   [place] [facility] [place], [place], [place]   11 months ago Type 2 diabetes mellitus with hyperglycemia, without long-term current use of insulin [facility])   [place] [facility] [place], [place] W, NP   1 year ago Encounter for Papanicolaou smear for cervical cancer screening   [place] [facility] [place], [place] W, NP   1 year ago Type 2 diabetes mellitus with  hyperglycemia, without long-term current use of insulin [facility])   [place] [facility] [place], [place] W, NP               diltiazem (CARDIZEM CD) 180 MG 24 hr capsule 90 capsule 1    Sig: TAKE 1 CAPSULE (180 MG TOTAL) BY MOUTH DAILY.     Cardiovascular:  Calcium Channel Blockers Passed - 05/11/2021 11:11 AM      Passed - Last BP in normal range    BP Readings from Last 1 Encounters:  01/16/21 129/77          Passed - Valid encounter within last 6 months    Recent Outpatient Visits           3 months ago Type 2 diabetes mellitus with hyperglycemia, without long-term current use of insulin [facility])   [facility] [place], [place] W, NP   6 months ago Type 2 diabetes mellitus with hyperglycemia, without long-term current use of insulin [facility])   [place] [facility] [place], Marylene Land M, [place]   11 months ago Type 2 diabetes mellitus with hyperglycemia, without long-term current use of insulin (HCC)  [facility] [place], Shea Stakes, NP   1 year ago Encounter for Papanicolaou smear for cervical cancer screening   [facility] [place], [place] W, NP   1 year ago Type 2 diabetes mellitus with hyperglycemia, without long-term current use of insulin [facility])   [facility] [place], Shea Stakes, NP

## 2021-05-12 NOTE — Progress Notes (Signed)
[name] is a 50 y.o. female who presents to [facility] clinic today with no complaints.    Pap Smear: Pap smear not completed today. Last Pap smear was 11/05/2019 at [facility] and was normal with negative HPV. Per patient has no history of an abnormal Pap smear. Last two Pap smear results are available in Epic.   Physical exam: Breasts Breasts symmetrical. No skin abnormalities bilateral breasts. No nipple retraction bilateral breasts. No nipple discharge bilateral breasts. No lymphadenopathy. No lumps palpated bilateral breasts. No complaints of pain or tenderness on exam.      MS DIGITAL SCREENING TOMO BILATERAL  Result Date: 11/26/2019 CLINICAL DATA:  Screening. EXAM: DIGITAL SCREENING BILATERAL MAMMOGRAM WITH TOMO AND CAD COMPARISON:  Previous exam(s). ACR Breast Density Category b: There are scattered areas of fibroglandular density. FINDINGS: There are no findings suspicious for malignancy. Images were processed with CAD. IMPRESSION: No mammographic evidence of malignancy. A result letter of this screening mammogram will be mailed directly to the patient. RECOMMENDATION: Screening mammogram in one year. (Code:SM-B-01Y) BI-RADS CATEGORY  1: Negative. Electronically Signed   By: Hulan Saas M.D.   On: 11/26/2019 16:12    Pelvic/Bimanual Pap is not indicated today per BCCCP guidelines.   Smoking History: Patient has never smoked.   Patient Navigation: Patient education provided. Access to services provided for patient through [place] program. Spanish interpreter Natale Lay from [facility] provided.   Colorectal Cancer Screening: Per patient has never had colonoscopy completed. FIT Test given to patient to complete. No complaints today.    Breast and Cervical Cancer Risk Assessment: Patient does not have family history of breast cancer, known genetic mutations, or radiation treatment to the chest before age 20. Patient does not have  history of cervical dysplasia, immunocompromised, or DES exposure in-utero.  Risk Assessment     Risk Scores       05/12/2021 11/22/2019   Last edited by: Meryl Dare, CMA [place], Carlye Grippe, RN   5-year risk: 0.7 % 0.6 %   Lifetime risk: 6.2 % 6.4 %            A: BCCCP exam without pap smear No complaints.  P: Referred patient to the [facility] for a screening mammogram on mobile unit. Appointment scheduled Tuesday, May 12, 2021 at 1500.  Priscille Heidelberg, RN 05/12/2021 2:43 PM

## 2021-05-12 NOTE — Patient Instructions (Signed)
Explained breast self awareness with [name]. Patient did not need a Pap smear today due to last Pap smear and HPV typing was 10/16/2019. Let her know BCCCP will cover Pap smears and HPV typing every 5 years unless has a history of abnormal Pap smears. Referred patient to the [facility] for a screening mammogram on mobile unit. Appointment scheduled Tuesday, May 12, 2021 at 1500. Patient escorted to the mobile unit following BCCCP appointment for her screening mammogram. Let patient know the Breast Center will follow up with her within the next couple weeks with results of her mammogram by letter or phone. Isabella Joyce verbalized understanding.  Smt [place], Kathaleen Maser, RN 2:43 PM

## 2021-05-13 ENCOUNTER — Other Ambulatory Visit: Payer: Self-pay

## 2021-05-13 MED ORDER — DILTIAZEM HCL ER COATED BEADS 180 MG PO CP24
ORAL_CAPSULE | ORAL | 0 refills | Status: DC
Start: 2021-05-13 — End: 2021-06-03
  Filled 2021-05-13: qty 30, 30d supply, fill #0

## 2021-05-13 MED ORDER — LISINOPRIL 40 MG PO TABS
ORAL_TABLET | Freq: Every day | ORAL | 0 refills | Status: DC
  Filled 2021-05-13: qty 30, 30d supply, fill #0

## 2021-05-14 ENCOUNTER — Other Ambulatory Visit: Payer: Self-pay

## 2021-05-23 LAB — FECAL OCCULT BLOOD, IMMUNOCHEMICAL: Fecal Occult Bld: NEGATIVE

## 2021-05-24 ENCOUNTER — Other Ambulatory Visit: Payer: Self-pay | Admitting: Family Medicine

## 2021-05-24 DIAGNOSIS — I10 Essential (primary) hypertension: Secondary | ICD-10-CM

## 2021-05-24 MED FILL — Lancets: 50 days supply | Qty: 100 | Fill #0 | Status: AC

## 2021-05-25 ENCOUNTER — Other Ambulatory Visit: Payer: Self-pay

## 2021-05-25 ENCOUNTER — Telehealth: Payer: Self-pay

## 2021-05-25 NOTE — Telephone Encounter (Signed)
Called patient via [facility] # [phone] to give FIT test results. Informed patient that FIT test was Normal. Patient voiced understanding.

## 2021-05-26 ENCOUNTER — Telehealth (HOSPITAL_BASED_OUTPATIENT_CLINIC_OR_DEPARTMENT_OTHER): Payer: No Typology Code available for payment source | Admitting: Nurse Practitioner

## 2021-05-26 ENCOUNTER — Encounter: Payer: Self-pay | Admitting: Nurse Practitioner

## 2021-05-26 ENCOUNTER — Other Ambulatory Visit: Payer: Self-pay

## 2021-05-26 DIAGNOSIS — Z9109 Other allergy status, other than to drugs and biological substances: Secondary | ICD-10-CM

## 2021-05-26 MED ORDER — AZELASTINE HCL 0.05 % OP SOLN
1.0000 [drp] | Freq: Two times a day (BID) | OPHTHALMIC | 12 refills | Status: DC
  Filled 2021-05-26: qty 6, 24d supply, fill #0

## 2021-05-26 MED ORDER — LORATADINE 10 MG PO TABS
10.0000 mg | ORAL_TABLET | Freq: Every day | ORAL | 11 refills | Status: DC
  Filled 2021-05-26 – 2021-11-03 (×2): qty 30, 30d supply, fill #0

## 2021-05-26 NOTE — Progress Notes (Signed)
Virtual Visit Note Due to national recommendations of social distancing due to COVID 19, virtual visit is felt to be most appropriate for this patient at this time.  I discussed the limitations, risks, security and privacy concerns of performing an evaluation and management service by video and the availability of in person appointments. I also discussed with the patient that there may be a patient responsible charge related to this service. The patient expressed understanding and agreed to proceed.    I connected with Isabella Joyce on 05/26/21  at  11:10 AM EST  EDT by VIDEO and verified that I am speaking with the correct person using two identifiers.   Location of Patient: Private Residence   [facility] of Provider[facility] [place] [facility]    Persons participating in VIRTUAL visit: Bertram Denver FNP-BC Astra Gregg  VIDEO [place] [id number]    History of Present Illness: VIRTUAL visit for: Allergy symptoms  Patient's symptoms include itchy eyes, swelling of eyes, and burning of eyes . Reports eyes were initially red however sclera of bilateral eyes today are normal on exam.  Current triggers include exposure to no known precipitant however she has been outside recently and symptom onset was thereafter. The patient has been suffering from these symptoms for approximately 2 weeks. She denies any other ENT symptoms    Past Medical History:  Diagnosis Date   COVID-19 virus infection 12/12/2018   Diabetes mellitus without complication (HCC) 2009   Hearing deficit    Hyperlipidemia    Hypertension 2008    History reviewed. No pertinent surgical history.  Family History  Problem Relation Age of Onset   Diabetes Mother    Hypertension Mother    Diabetes Father    Hypertension Father    Cancer Neg Hx    Heart disease Neg Hx     Social History   Socioeconomic History   Marital status: Married    Spouse name: Charlcie Cradle    Number of children: 3     Years of education: 8 th    Highest education level: Not on file  Occupational History   Occupation: Unemployed   Tobacco Use   Smoking status: Never   Smokeless tobacco: Never  Vaping Use   Vaping Use: Never used  Substance and Sexual Activity   Alcohol use: No   Drug use: No   Sexual activity: Yes    Birth control/protection: I.U.D.  Other Topics Concern   Not on file  Social History Narrative   Lives at home with husband.    Two children live at home.   Oldest son, lives out of state in [place].    Social Determinants of Corporate investment banker Strain: Not on file  Food Insecurity: No Food Insecurity   Worried About Programme researcher, broadcasting/film/video in the Last Year: Never true   Barista in the Last Year: Never true  Transportation Needs: Unmet Transportation Needs   Lack of Transportation (Medical): Yes   Lack of Transportation (Non-Medical): Yes  Physical Activity: Not on file  Stress: Not on file  Social Connections: Not on file     Observations/Objective: Awake, alert and oriented x 3   Review of Systems  Constitutional:  Negative for fever, malaise/fatigue and weight loss.  HENT: Negative.  Negative for nosebleeds.   Eyes:  Positive for redness. Negative for blurred vision, double vision, photophobia, pain and discharge.       SEE HPI  Respiratory: Negative.  Negative for cough and shortness of breath.   Cardiovascular: Negative.  Negative for chest pain, palpitations and leg swelling.  Gastrointestinal: Negative.  Negative for heartburn, nausea and vomiting.  Musculoskeletal: Negative.  Negative for myalgias.  Neurological: Negative.  Negative for dizziness, focal weakness, seizures and headaches.  Endo/Heme/Allergies:  Positive for environmental allergies.       SEE HPI  Psychiatric/Behavioral: Negative.  Negative for suicidal ideas.    Assessment and Plan: Diagnoses and all orders for this visit:  Environmental allergies -     loratadine  (CLARITIN) 10 MG tablet; Take 1 tablet (10 mg total) by mouth daily. -     azelastine (OPTIVAR) 0.05 % ophthalmic solution; Place 1 drop into both eyes 2 (two) times daily.    Follow Up Instructions Return if symptoms worsen or fail to improve.     I discussed the assessment and treatment plan with the patient. The patient was provided an opportunity to ask questions and all were answered. The patient agreed with the plan and demonstrated an understanding of the instructions.   The patient was advised to call back or seek an in-person evaluation if the symptoms worsen or if the condition fails to improve as anticipated.  I provided 10 minutes of face-to-face time during this encounter including median intraservice time, reviewing previous notes, labs, imaging, medications and explaining diagnosis and management.  Claiborne Rigg, FNP-BC

## 2021-05-28 ENCOUNTER — Other Ambulatory Visit: Payer: Self-pay

## 2021-06-03 ENCOUNTER — Other Ambulatory Visit: Payer: Self-pay

## 2021-06-03 ENCOUNTER — Other Ambulatory Visit: Payer: Self-pay | Admitting: Family Medicine

## 2021-06-03 DIAGNOSIS — I10 Essential (primary) hypertension: Secondary | ICD-10-CM

## 2021-06-03 MED ORDER — LISINOPRIL 40 MG PO TABS
ORAL_TABLET | Freq: Every day | ORAL | 2 refills | Status: DC
  Filled 2021-06-03: qty 30, fill #0
  Filled 2021-06-10: qty 30, 30d supply, fill #0
  Filled 2021-07-06: qty 30, 30d supply, fill #1
  Filled 2021-07-07: qty 30, 30d supply, fill #0
  Filled 2021-08-08: qty 30, 30d supply, fill #1

## 2021-06-03 MED ORDER — DILTIAZEM HCL ER COATED BEADS 180 MG PO CP24
ORAL_CAPSULE | ORAL | 2 refills | Status: DC
  Filled 2021-06-03: qty 30, fill #0
  Filled 2021-06-10: qty 30, 30d supply, fill #0
  Filled 2021-07-06: qty 30, 30d supply, fill #1
  Filled 2021-07-07: qty 30, 30d supply, fill #0
  Filled 2021-08-08: qty 30, 30d supply, fill #1

## 2021-06-05 ENCOUNTER — Other Ambulatory Visit: Payer: Self-pay

## 2021-06-11 ENCOUNTER — Other Ambulatory Visit: Payer: Self-pay

## 2021-07-07 ENCOUNTER — Other Ambulatory Visit: Payer: Self-pay

## 2021-07-16 ENCOUNTER — Other Ambulatory Visit: Payer: Self-pay

## 2021-07-21 ENCOUNTER — Other Ambulatory Visit: Payer: Self-pay | Admitting: Pharmacist

## 2021-07-21 ENCOUNTER — Other Ambulatory Visit: Payer: Self-pay

## 2021-07-21 DIAGNOSIS — E1165 Type 2 diabetes mellitus with hyperglycemia: Secondary | ICD-10-CM

## 2021-07-21 MED ORDER — GLUCOSE BLOOD VI STRP
ORAL_STRIP | 0 refills | Status: DC
  Filled 2021-07-21: qty 100, fill #0
  Filled 2021-07-21: qty 100, 50d supply, fill #0

## 2021-08-08 ENCOUNTER — Other Ambulatory Visit: Payer: Self-pay | Admitting: Nurse Practitioner

## 2021-08-08 ENCOUNTER — Other Ambulatory Visit: Payer: Self-pay | Admitting: Family Medicine

## 2021-08-08 DIAGNOSIS — E1165 Type 2 diabetes mellitus with hyperglycemia: Secondary | ICD-10-CM

## 2021-08-10 ENCOUNTER — Other Ambulatory Visit: Payer: Self-pay

## 2021-08-10 MED ORDER — TRUEPLUS LANCETS 28G MISC
0 refills | Status: DC
  Filled 2021-08-10: qty 100, 50d supply, fill #0

## 2021-08-10 MED ORDER — GLIMEPIRIDE 4 MG PO TABS
4.0000 mg | ORAL_TABLET | Freq: Every day | ORAL | 0 refills | Status: DC
  Filled 2021-08-10: qty 30, 30d supply, fill #0

## 2021-08-10 MED ORDER — METFORMIN HCL ER 500 MG PO TB24
ORAL_TABLET | ORAL | 0 refills | Status: DC
  Filled 2021-08-10: qty 120, 30d supply, fill #0

## 2021-08-10 MED ORDER — TRUE METRIX BLOOD GLUCOSE TEST VI STRP
ORAL_STRIP | 0 refills | Status: DC
  Filled 2021-08-10: qty 100, fill #0
  Filled 2021-08-28: qty 100, 90d supply, fill #0

## 2021-08-10 NOTE — Telephone Encounter (Signed)
Requested medication (s) are due for refill today: Yes  Requested medication (s) are on the active medication list: No  Last refill:  05/27/20  Future visit scheduled: No  Notes to clinic:  Prescription expired.    Requested Prescriptions  Pending Prescriptions Disp Refills   TRUEplus Lancets 28G MISC 100 each 12    Sig: USE AS DIRECTED 2 TIMES DAILY     Endocrinology: Diabetes - Testing Supplies Passed - 08/08/2021  9:29 PM      Passed - Valid encounter within last 12 months    Recent Outpatient Visits           2 months ago Environmental allergies   [facility] [place], [place] W, NP   6 months ago Type 2 diabetes mellitus with hyperglycemia, without long-term current use of insulin [facility])   [place] [facility] [place], [place] W, NP   9 months ago Type 2 diabetes mellitus with hyperglycemia, without long-term current use of insulin [facility])   [facility] [place], [place], [place]   1 year ago Type 2 diabetes mellitus with hyperglycemia, without long-term current use of insulin [facility])   [place] [facility] [place], Shea Stakes, NP   1 year ago Encounter for Papanicolaou smear for cervical cancer screening   [facility] [place], Shea Stakes, NP

## 2021-08-10 NOTE — Telephone Encounter (Signed)
Requested medication (s) are due for refill today: yes  Requested medication (s) are on the active medication list: yes  Last refill:  Amaryl 02/09/21 #30 with 5 RF, Glucophage 02/09/21 #120 with 0 RF  Future visit scheduled: no, was asked to return in Nov. No upcoming appt scheduled.Notes to clinic:    Has already had a curtesy refill and there is no upcoming appointment scheduled.     Requested Prescriptions  Pending Prescriptions Disp Refills   glimepiride (AMARYL) 4 MG tablet 30 tablet 5    Sig: Take 1 tablet (4 mg total) by mouth daily before breakfast.     Endocrinology:  Diabetes - Sulfonylureas Failed - 08/08/2021  9:29 PM      Failed - HBA1C is between 0 and 7.9 and within 180 days    HbA1c, POC (prediabetic range)  Date Value Ref Range Status  06/02/2018 6.4 5.7 - 6.4 % Final   HbA1c, POC (controlled diabetic range)  Date Value Ref Range Status  10/15/2020 6.7 0.0 - 7.0 % Final   Hgb A1c MFr Bld  Date Value Ref Range Status  01/16/2021 7.2 (H) 4.8 - 5.6 % Final    Comment:             Prediabetes: 5.7 - 6.4          Diabetes: >6.4          Glycemic control for adults with diabetes: <7.0           Passed - Cr in normal range and within 360 days    Creat  Date Value Ref Range Status  10/20/2015 0.53 0.50 - 1.10 mg/dL Final   Creatinine, Ser  Date Value Ref Range Status  01/16/2021 0.69 0.57 - 1.00 mg/dL Final   Creatinine, Urine  Date Value Ref Range Status  10/20/2015 54 20 - 320 mg/dL Final          Passed - Valid encounter within last 6 months    Recent Outpatient Visits           2 months ago Environmental allergies   [place], Vernia Buff, NP   6 months ago Type 2 diabetes mellitus with hyperglycemia, without long-term current use of insulin ([place])   [place], Vernia Buff, NP   9 months ago Type 2 diabetes mellitus with hyperglycemia, without long-term current use of  insulin [facility]   [place], [place], [place]   1 year ago Type 2 diabetes mellitus with hyperglycemia, without long-term current use of insulin ([place])   [place], [place] W, NP   1 year ago Encounter for Papanicolaou smear for cervical cancer screening   [place], [place] W, NP               metFORMIN (GLUCOPHAGE-XR) 500 MG 24 hr tablet 120 tablet 5    Sig: TAKE 2 TABLETS BY MOUTH TWICE A DAY AT MEALS     Endocrinology:  Diabetes - Biguanides Failed - 08/08/2021  9:29 PM      Failed - HBA1C is between 0 and 7.9 and within 180 days    HbA1c, POC (prediabetic range)  Date Value Ref Range Status  06/02/2018 6.4 5.7 - 6.4 % Final   HbA1c, POC (controlled diabetic range)  Date Value Ref Range Status  10/15/2020 6.7 0.0 - 7.0 % Final  Hgb A1c MFr Bld  Date Value Ref Range Status  01/16/2021 7.2 (H) 4.8 - 5.6 % Final    Comment:             Prediabetes: 5.7 - 6.4          Diabetes: >6.4          Glycemic control for adults with diabetes: <7.0           Failed - B12 Level in normal range and within 720 days    No results found for: VITAMINB12        Failed - CBC within normal limits and completed in the last 12 months    WBC  Date Value Ref Range Status  01/16/2021 7.5 3.4 - 10.8 x10E3/uL Final   RBC  Date Value Ref Range Status  01/16/2021 4.37 3.77 - 5.28 x10E6/uL Final   Hemoglobin  Date Value Ref Range Status  01/16/2021 13.8 11.1 - 15.9 g/dL Final   Hematocrit  Date Value Ref Range Status  01/16/2021 40.5 34.0 - 46.6 % Final   MCHC  Date Value Ref Range Status  01/16/2021 34.1 31.5 - 35.7 g/dL Final   [facility]  Date Value Ref Range Status  01/16/2021 31.6 26.6 - 33.0 pg Final   MCV  Date Value Ref Range Status  01/16/2021 93 79 - 97 fL Final   No results found for: PLTCOUNTKUC, LABPLAT, POCPLA RDW  Date Value Ref Range Status  01/16/2021  13.1 11.7 - 15.4 % Final         Passed - Cr in normal range and within 360 days    Creat  Date Value Ref Range Status  10/20/2015 0.53 0.50 - 1.10 mg/dL Final   Creatinine, Ser  Date Value Ref Range Status  01/16/2021 0.69 0.57 - 1.00 mg/dL Final   Creatinine, Urine  Date Value Ref Range Status  10/20/2015 54 20 - 320 mg/dL Final          Passed - eGFR in normal range and within 360 days    GFR, Est African American  Date Value Ref Range Status  10/20/2015 >89 >=60 mL/min Final   GFR calc Af Amer  Date Value Ref Range Status  05/28/2020 120 >59 mL/min/1.73 Final    Comment:    **In accordance with recommendations from the NKF-ASN Task force,**   [facility] is in the process of updating its eGFR calculation to the   2021 CKD-EPI creatinine equation that estimates kidney function   without a race variable.    GFR, Est Non African American  Date Value Ref Range Status  10/20/2015 >89 >=60 mL/min Final    Comment:      The estimated GFR is a calculation valid for adults (>=89 years old) that uses the CKD-EPI algorithm to adjust for age and sex. It is   not to be used for children, pregnant women, hospitalized patients,    patients on dialysis, or with rapidly changing kidney function. According to the NKDEP, eGFR >89 is normal, 60-89 shows mild impairment, 30-59 shows moderate impairment, 15-29 shows severe impairment and <15 is ESRD.      GFR calc non Af Amer  Date Value Ref Range Status  05/28/2020 104 >59 mL/min/1.73 Final   eGFR  Date Value Ref Range Status  01/16/2021 106 >59 mL/min/1.73 Final          Passed - Valid encounter within last 6 months    Recent Outpatient Visits  2 months ago Environmental allergies   [place], [place] W, NP   6 months ago Type 2 diabetes mellitus with hyperglycemia, without long-term current use of insulin [facility])   [place], [place] W,  NP   9 months ago Type 2 diabetes mellitus with hyperglycemia, without long-term current use of insulin [facility]   [place], [place], [place]   1 year ago Type 2 diabetes mellitus with hyperglycemia, without long-term current use of insulin [facility])   [place], Vernia Buff, NP   1 year ago Encounter for Papanicolaou smear for cervical cancer screening   [place], Vernia Buff, NP

## 2021-08-10 NOTE — Telephone Encounter (Signed)
°  Notes to clinic:  Pharm requesting True Metrix test strips.   Requested Prescriptions  Pending Prescriptions Disp Refills   glucose blood (TRUE METRIX BLOOD GLUCOSE TEST) test strip 100 each 0    Sig: Use to check blood sugar once daily.     Endocrinology: Diabetes - Testing Supplies Passed - 08/08/2021  9:29 PM      Passed - Valid encounter within last 12 months    Recent Outpatient Visits           2 months ago Environmental allergies   [place] [facility] [place], [place] W, NP   6 months ago Type 2 diabetes mellitus with hyperglycemia, without long-term current use of insulin [facility])   [facility] [place], [place] W, NP   9 months ago Type 2 diabetes mellitus with hyperglycemia, without long-term current use of insulin [facility])   [facility] And Wellness [place], [place], [place]   1 year ago Type 2 diabetes mellitus with hyperglycemia, without long-term current use of insulin [facility])   [place] [facility] [place], Shea Stakes, NP   1 year ago Encounter for Papanicolaou smear for cervical cancer screening   [facility] [place], Shea Stakes, NP

## 2021-08-11 ENCOUNTER — Other Ambulatory Visit: Payer: Self-pay

## 2021-08-28 ENCOUNTER — Other Ambulatory Visit: Payer: Self-pay

## 2021-09-01 ENCOUNTER — Other Ambulatory Visit: Payer: Self-pay

## 2021-09-08 ENCOUNTER — Other Ambulatory Visit: Payer: Self-pay

## 2021-09-08 ENCOUNTER — Other Ambulatory Visit: Payer: Self-pay | Admitting: Family Medicine

## 2021-09-08 DIAGNOSIS — I10 Essential (primary) hypertension: Secondary | ICD-10-CM

## 2021-09-08 DIAGNOSIS — E1165 Type 2 diabetes mellitus with hyperglycemia: Secondary | ICD-10-CM

## 2021-09-09 ENCOUNTER — Other Ambulatory Visit: Payer: Self-pay

## 2021-09-10 ENCOUNTER — Other Ambulatory Visit: Payer: Self-pay

## 2021-09-11 ENCOUNTER — Other Ambulatory Visit: Payer: Self-pay

## 2021-09-15 ENCOUNTER — Telehealth: Payer: No Typology Code available for payment source | Admitting: Nurse Practitioner

## 2021-09-16 ENCOUNTER — Other Ambulatory Visit: Payer: Self-pay | Admitting: Family Medicine

## 2021-09-16 ENCOUNTER — Other Ambulatory Visit: Payer: Self-pay

## 2021-09-16 DIAGNOSIS — I10 Essential (primary) hypertension: Secondary | ICD-10-CM

## 2021-09-16 MED ORDER — DILTIAZEM HCL ER COATED BEADS 180 MG PO CP24
ORAL_CAPSULE | ORAL | 0 refills | Status: DC
  Filled 2021-09-16: qty 30, 30d supply, fill #0

## 2021-10-07 ENCOUNTER — Ambulatory Visit: Payer: No Typology Code available for payment source | Attending: Physician Assistant | Admitting: Physician Assistant

## 2021-10-07 ENCOUNTER — Other Ambulatory Visit: Payer: Self-pay

## 2021-10-07 VITALS — BP 143/96 | HR 73 | Wt 184.4 lb

## 2021-10-07 DIAGNOSIS — Z23 Encounter for immunization: Secondary | ICD-10-CM

## 2021-10-07 DIAGNOSIS — E1169 Type 2 diabetes mellitus with other specified complication: Secondary | ICD-10-CM

## 2021-10-07 DIAGNOSIS — E1165 Type 2 diabetes mellitus with hyperglycemia: Secondary | ICD-10-CM

## 2021-10-07 DIAGNOSIS — E785 Hyperlipidemia, unspecified: Secondary | ICD-10-CM

## 2021-10-07 DIAGNOSIS — I10 Essential (primary) hypertension: Secondary | ICD-10-CM

## 2021-10-07 DIAGNOSIS — Z789 Other specified health status: Secondary | ICD-10-CM

## 2021-10-07 LAB — POCT GLYCOSYLATED HEMOGLOBIN (HGB A1C): HbA1c, POC (controlled diabetic range): 7.4 % — AB (ref 0.0–7.0)

## 2021-10-07 LAB — GLUCOSE, POCT (MANUAL RESULT ENTRY): POC Glucose: 212 mg/dl — AB (ref 70–99)

## 2021-10-07 MED ORDER — ATORVASTATIN CALCIUM 40 MG PO TABS
ORAL_TABLET | Freq: Every day | ORAL | 3 refills | Status: DC
  Filled 2021-10-07: qty 90, 90d supply, fill #0
  Filled 2021-11-03 – 2021-11-12 (×2): qty 90, 90d supply, fill #1
  Filled 2022-03-08: qty 30, 30d supply, fill #1

## 2021-10-07 MED ORDER — HYDROCHLOROTHIAZIDE 12.5 MG PO CAPS
12.5000 mg | ORAL_CAPSULE | Freq: Every day | ORAL | 3 refills | Status: DC
  Filled 2021-10-07: qty 90, 90d supply, fill #0
  Filled 2021-11-03 – 2022-02-12 (×3): qty 90, 90d supply, fill #1

## 2021-10-07 MED ORDER — LISINOPRIL 40 MG PO TABS
ORAL_TABLET | Freq: Every day | ORAL | 1 refills | Status: DC
  Filled 2021-10-07: qty 90, 90d supply, fill #0
  Filled 2021-11-03 – 2022-01-07 (×3): qty 90, 90d supply, fill #1

## 2021-10-07 MED ORDER — DILTIAZEM HCL ER COATED BEADS 180 MG PO CP24
ORAL_CAPSULE | ORAL | 1 refills | Status: DC
  Filled 2021-10-07: qty 90, fill #0
  Filled 2021-10-13: qty 30, 30d supply, fill #0
  Filled 2021-11-03 – 2021-11-12 (×2): qty 30, 30d supply, fill #1
  Filled 2021-12-11: qty 90, 90d supply, fill #2
  Filled 2022-03-08: qty 30, 30d supply, fill #3

## 2021-10-07 MED ORDER — METFORMIN HCL ER 500 MG PO TB24
ORAL_TABLET | ORAL | 1 refills | Status: DC
Start: 2021-10-07 — End: 2022-04-28
  Filled 2021-10-07: qty 120, 30d supply, fill #0
  Filled 2021-11-03 (×2): qty 120, 30d supply, fill #1
  Filled 2021-12-11: qty 360, 90d supply, fill #2

## 2021-10-07 MED ORDER — GLIMEPIRIDE 4 MG PO TABS
4.0000 mg | ORAL_TABLET | Freq: Every day | ORAL | 1 refills | Status: DC
Start: 2021-10-07 — End: 2022-04-07
  Filled 2021-10-07: qty 30, 30d supply, fill #0
  Filled 2021-11-03 (×2): qty 30, 30d supply, fill #1
  Filled 2021-12-11: qty 90, 90d supply, fill #2
  Filled 2022-03-08: qty 30, 30d supply, fill #3

## 2021-10-07 NOTE — Patient Instructions (Signed)
Vacuna recombinante contra el herpes zster (culebrilla): lo que debe saber Recombinant Zoster (Shingles) Vaccine: What You Need to Know 1. Por qu vacunarse? La vacuna recombinante contra el virus del herpes zster (culebrilla) puede prevenir la culebrilla. La culebrilla (tambin denominada herpes zster o simplemente zster) es una erupcin cutnea dolorosa, que suele aparecer con ampollas. Adems de la erupcin cutnea, la culebrilla puede causar fiebre, dolor de cabeza, escalofros o malestar estomacal. Con poca frecuencia, la culebrilla puede provocar complicaciones como neumona, problemas de audicin, ceguera, inflamacin cerebral (encefalitis) o la muerte. El riesgo de culebrilla aumenta con la edad. La complicacin ms frecuente de la culebrilla es el dolor a largo plazo de los nervios, llamado neuralgia posherptica (NPH). La NPH se produce en las zonas donde estaba la erupcin de la culebrilla y puede durar meses o aos despus de que la erupcin desaparece. El dolor causado por la NPH puede ser intenso y debilitante. El riesgo de NPH aumenta con la edad. Un adulto mayor con culebrilla tiene ms probabilidades de presentar NPH y tener un dolor ms prolongado y ms intenso que una persona ms joven. Las personas con sistemas inmunitarios debilitados tambin tienen un mayor riesgo de padecer culebrilla y complicaciones de la enfermedad. La causa de la culebrilla es el virus varicela zster, el mismo virus que provoca la varicela. Luego de contraer varicela, el virus permanece en el cuerpo y aos despus puede causar culebrilla. La culebrilla no puede transmitirse de una persona a otra, pero el virus que causa la culebrilla puede transmitirse y causar varicela en alguien que nunca ha tenido varicela o nunca ha recibido la vacuna contra la varicela. 2. Vacuna recombinante contra la culebrilla La vacuna recombinante contra la culebrilla proporciona una proteccin potente contra la culebrilla. Al  prevenir la culebrilla, la vacuna recombinante contra la culebrilla tambin protege contra la NPH y otras complicaciones. La vacuna recombinante contra la culebrilla se recomienda para: Adultos de 50 aos en adelante Adultos a partir de 19 aos que tienen el sistema inmunitario debilitado debido a enfermedades o tratamientos La vacuna de la culebrilla se administra en una serie de 2 dosis. Para la mayora de las personas, la segunda dosis debe aplicarse de 2 a 6 meses despus de la primera dosis. Algunas personas que tienen o tendrn el sistema inmunitario debilitado pueden recibir la segunda dosis de 1 a 2 meses despus de la primera dosis. Pida orientacin a su mdico. Se recomienda a las personas que han tenido culebrilla en el pasado y a las personas que han recibido la vacuna contra la varicela que reciban la vacuna recombinante contra la culebrilla. La vacuna tambin se recomienda para las personas que ya han recibido otro tipo de vacuna para la culebrilla, la vacuna viva atenuada contra la culebrilla. No hay virus vivos en la vacuna recombinante contra la culebrilla. La vacuna contra la culebrilla puede administrarse en el mismo momento que otras vacunas. 3. Hable con el mdico Comunquese con la persona que le coloca las vacunas si la persona que la recibe: Ha tenido una reaccin alrgica despus de una dosis previa de la vacuna recombinante contra la culebrilla o tiene alguna alergia grave, potencialmente mortal Sufre actualmente un episodio de culebrilla Est embarazada En algunos casos, es posible que el mdico decida posponer la aplicacin de la vacuna contra la culebrilla hasta una visita en el futuro. Las personas que sufren trastornos menores, como un resfro, pueden vacunarse. Las personas que tienen enfermedades moderadas o graves generalmente deben esperar hasta recuperarse para   poder vacunarse con la vacuna recombinante contra la culebrilla. Su mdico puede darle ms informacin. 4.  Riesgos de una reaccin a la vacuna Es muy comn tener el brazo dolorido con dolor leve o moderado despus de recibir la vacuna recombinante contra la culebrilla. Tambin puede haber enrojecimiento e hinchazn en el lugar de la inyeccin. Es frecuente tener cansancio, dolor muscular, dolor de cabeza, escalofros, fiebre, dolor de estmago y nuseas despus de recibir la vacuna recombinante contra la culebrilla. Estos efectos secundarios pueden impedir temporalmente que una persona vacunada realice sus actividades cotidianas. Los sntomas por lo general desaparecen por s solos en 2 a 3 das. Debera recibir la segunda dosis de la vacuna recombinante contra la culebrilla incluso si tuvo alguna de estas reacciones despus de la primera dosis. Con muy poca frecuencia se ha informado sndrome de Guillain-Barr (SGB), un trastorno grave del sistema nervioso, despus de la vacuna recombinante contra la culebrilla. Las personas a veces se desmayan despus de procedimientos mdicos, incluida la vacunacin. Informe al mdico si se siente mareado, tiene cambios en la visin o zumbidos en los odos. Al igual que con cualquier medicamento, existe una probabilidad muy remota de que una vacuna cause una reaccin alrgica grave, otra lesin grave o la muerte. 5. Qu pasa si se presenta un problema grave? Podra producirse una reaccin alrgica despus de que la persona vacunada abandone la clnica. Si observa signos de una reaccin alrgica grave (ronchas, hinchazn de la cara y la garganta, dificultad para respirar, latidos cardacos acelerados, mareos o debilidad), llame al 9-1-1 y lleve a la persona al hospital ms cercano. Si se presentan otros signos que le preocupan, comunquese con su mdico. Las reacciones adversas deben informarse al Sistema de Informe de Eventos Adversos de Vacunas (VAERS). Por lo general, el mdico presenta este informe o puede hacerlo usted mismo. Visite el sitio web del VAERS en  www.vaers.hhs.gov o llame al 1-800-822-7967. El VAERS es solo para informar reacciones, y los miembros de su personal no proporcionan asesoramiento mdico. 6. Cmo puedo obtener ms informacin? Pregntele a su mdico. Comunquese con el servicio de salud de su localidad o su estado. Visite el sitio web de la Food and Drug Administration (FDA) (Administracin de Alimentos y Medicamentos) para ver los prospectos de las vacunas e informacin adicional en www.fda.gov/vaccinesblood-biologics/vaccines. Comunquese con los Centers for Disease Control and Prevention (CDC) (Centros para el Control y la Prevencin de Enfermedades): Llame al 1-800-232-4636 (1-800-CDC-INFO) o Visite el sitio web de los CDC en www.cdc.gov/vaccines. Fuente: Declaracin de informacin de los CDC sobre la vacuna recombinante contra la culebrilla (07/18/2020) Este mismo material est disponible en www.cdc.gov sin cargo. Esta informacin no tiene como fin reemplazar el consejo del mdico. Asegrese de hacerle al mdico cualquier pregunta que tenga. Document Revised: 05/19/2021 Document Reviewed: 08/14/2020 Elsevier Patient Education  2023 [employer]vier Inc.  

## 2021-10-07 NOTE — Progress Notes (Signed)
Patient ID: Isabella Joyce, female   DOB: Oct 16, 1970, 51 y.o.   MRN: [id number] ? ? ?Isabella Joyce, is a 51 y.o. female ? ?BOF:[id number] ? ?DPO:[id number] ? ?DOB - 20-May-1971 ? ?Chief Complaint  ?Patient presents with  ? Diabetes  ? Hypertension  ?    ? ?Subjective:  ? ?Isabella Joyce is a 51 y.o. female here today for med RF.  She has been out of meds for about 3 weeks.  Doing well.  No new concerns or issues.  She also wants to get shingles vaccine today.   ? ? ?No problems updated. ? ?ALLERGIES: ?Allergies  ?Allergen Reactions  ? Pollen Extract   ?  Hear swelling  ? ? ?PAST MEDICAL HISTORY: ?Past Medical History:  ?Diagnosis Date  ? COVID-19 virus infection 12/12/2018  ? Diabetes mellitus without complication ([place]) 4315  ? Hearing deficit   ? Hyperlipidemia   ? Hypertension 2008  ? ? ?MEDICATIONS AT HOME: ?Prior to Admission medications   ?Medication Sig Start Date End Date Taking? Authorizing Provider  ?atorvastatin (LIPITOR) 40 MG tablet TAKE 1 TABLET (40 MG TOTAL) BY MOUTH DAILY AT 6 PM. 10/07/21 10/07/22  Argentina Donovan, PA-C  ?azelastine (OPTIVAR) 0.05 % ophthalmic solution Place 1 drop into both eyes 2 (two) times daily. 05/26/21   [name], NP  ?Blood Glucose Monitoring Suppl (TRUE METRIX METER) w/Device KIT USE AS DIRECTED 2 TIMES DAILY 01/17/20   [name], NP  ?diltiazem (CARDIZEM CD) 180 MG 24 hr capsule TAKE 1 CAPSULE (180 MG TOTAL) BY MOUTH DAILY. 10/07/21 10/07/22  Argentina Donovan, PA-C  ?glimepiride (AMARYL) 4 MG tablet Take 1 tablet (4 mg total) by mouth daily before breakfast. 10/07/21   Argentina Donovan, PA-C  ?glucose blood (TRUE METRIX BLOOD GLUCOSE TEST) test strip Use to check blood sugar once daily. 08/10/21   Charlott Rakes, MD  ?hydrochlorothiazide (MICROZIDE) 12.5 MG capsule Take 1 capsule (12.5 mg total) by mouth daily. 10/07/21   Argentina Donovan, PA-C  ?lisinopril (ZESTRIL) 40 MG tablet TAKE 1 TABLET (40 MG TOTAL) BY MOUTH DAILY. 10/07/21 10/07/22   Argentina Donovan, PA-C  ?loratadine (CLARITIN) 10 MG tablet Take 1 tablet (10 mg total) by mouth daily. 05/26/21   [name], NP  ?metFORMIN (GLUCOPHAGE-XR) 500 MG 24 hr tablet TAKE 2 TABLETS BY MOUTH TWICE A DAY AT MEALS 10/07/21 10/07/22  Argentina Donovan, PA-C  ?TRUEplus Lancets 28G MISC USE AS DIRECTED 2 TIMES DAILY 08/10/21 08/10/22  Charlott Rakes, MD  ? ? ?ROS: ?Neg HEENT ?Neg resp ?Neg cardiac ?Neg GI ?Neg GU ?Neg MS ?Neg psych ?Neg neuro ? ?Objective:  ? ?Vitals:  ? 10/07/21 1517  ?BP: (!) 143/96  ?Pulse: 73  ?SpO2: 96%  ?Weight: 184 lb 6.4 oz (83.6 kg)  ? ?Exam ?General appearance : Awake, alert, not in any distress. Speech Clear. Not toxic looking ?HEENT: Atraumatic and Normocephalic ?Neck: Supple, no JVD. No cervical lymphadenopathy.  ?Chest: Good air entry bilaterally, CTAB.  No rales/rhonchi/wheezing ?CVS: S1 S2 regular, no murmurs.  ?Extremities: B/L Lower Ext shows no edema, both legs are warm to touch ?Neurology: Awake alert, and oriented X 3, CN II-XII intact, Non focal ?Skin: No Rash ? ?Data Review ?Lab Results  ?Component Value Date  ? HGBA1C 7.4 (A) 10/07/2021  ? HGBA1C 7.2 (H) 01/16/2021  ? HGBA1C 6.7 10/15/2020  ? ? ?Assessment & Plan  ? ?1. Type 2 diabetes mellitus with hyperglycemia, without long-term current use of  insulin ([place]) ?Uncontrolled but out of meds several weeks ?- Glucose (CBG) ?- HgB A1c ?- glimepiride (AMARYL) 4 MG tablet; Take 1 tablet (4 mg total) by mouth daily before breakfast.  Dispense: 90 tablet; Refill: 1 ?- metFORMIN (GLUCOPHAGE-XR) 500 MG 24 hr tablet; TAKE 2 TABLETS BY MOUTH TWICE A DAY AT MEALS  Dispense: 360 tablet; Refill: 1 ?- Comprehensive metabolic panel ? ?2. Essential hypertension ?Uncontrolled but not on meds several weeks.   ?- diltiazem (CARDIZEM CD) 180 MG 24 hr capsule; TAKE 1 CAPSULE (180 MG TOTAL) BY MOUTH DAILY.  Dispense: 90 capsule; Refill: 1 ?- hydrochlorothiazide (MICROZIDE) 12.5 MG capsule; Take 1 capsule (12.5 mg total) by mouth daily.   Dispense: 90 capsule; Refill: 3 ?- lisinopril (ZESTRIL) 40 MG tablet; TAKE 1 TABLET (40 MG TOTAL) BY MOUTH DAILY.  Dispense: 90 tablet; Refill: 1 ?- Comprehensive metabolic panel ?- Lipid panel ? ?3. Hyperlipidemia associated with type 2 diabetes mellitus (HCC) ?- atorvastatin (LIPITOR) 40 MG tablet; TAKE 1 TABLET (40 MG TOTAL) BY MOUTH DAILY AT 6 PM.  Dispense: 90 tablet; Refill: 3 ?- Lipid panel ? ?4. Need for shingles vaccine ?Vaccine given ? ?5. Language barrier ?[employer] "[place]" interpreters used and additional time performing visit was required. ? ? ? ?Return in about 4 months (around 02/06/2022) for 3 weeks for blood sugar and BP-PCP for chronic conditions. ? ?The patient was given clear instructions to go to ER or return to medical center if symptoms don't improve, worsen or new problems develop. The patient verbalized understanding. The patient was told to call to get lab results if they haven't heard anything in the next week.  ? ? ? ? ?Freeman Caldron, PA-C ?[facility] ?[place], [place] ?[phone]   ?10/07/2021, 3:29 PM  ?

## 2021-10-13 ENCOUNTER — Other Ambulatory Visit: Payer: Self-pay

## 2021-11-03 ENCOUNTER — Other Ambulatory Visit: Payer: Self-pay | Admitting: Family Medicine

## 2021-11-03 ENCOUNTER — Other Ambulatory Visit: Payer: Self-pay

## 2021-11-03 ENCOUNTER — Other Ambulatory Visit (HOSPITAL_COMMUNITY): Payer: Self-pay

## 2021-11-03 DIAGNOSIS — E1165 Type 2 diabetes mellitus with hyperglycemia: Secondary | ICD-10-CM

## 2021-11-04 ENCOUNTER — Other Ambulatory Visit (HOSPITAL_COMMUNITY): Payer: Self-pay

## 2021-11-13 ENCOUNTER — Other Ambulatory Visit: Payer: Self-pay

## 2021-12-11 ENCOUNTER — Other Ambulatory Visit: Payer: Self-pay | Admitting: Family Medicine

## 2021-12-11 ENCOUNTER — Other Ambulatory Visit: Payer: Self-pay

## 2021-12-11 ENCOUNTER — Other Ambulatory Visit: Payer: Self-pay | Admitting: Pharmacist

## 2021-12-11 DIAGNOSIS — E1165 Type 2 diabetes mellitus with hyperglycemia: Secondary | ICD-10-CM

## 2021-12-11 MED ORDER — TRUEPLUS LANCETS 28G MISC
0 refills | Status: DC
  Filled 2021-12-11: qty 100, 100d supply, fill #0
  Filled 2021-12-24: qty 100, 30d supply, fill #0

## 2021-12-11 MED ORDER — TRUEPLUS LANCETS 28G MISC
0 refills | Status: DC
  Filled 2021-12-11: qty 100, fill #0

## 2021-12-11 MED ORDER — TRUE METRIX BLOOD GLUCOSE TEST VI STRP
ORAL_STRIP | 0 refills | Status: DC
  Filled 2021-12-11 (×2): qty 100, 100d supply, fill #0
  Filled 2022-01-07: qty 100, 25d supply, fill #0

## 2021-12-11 NOTE — Telephone Encounter (Signed)
Pt last OV 10/04/21, will refill medication.  Requested Prescriptions  Pending Prescriptions Disp Refills  . glucose blood (TRUE METRIX BLOOD GLUCOSE TEST) test strip 100 each 0    Sig: Use to check blood sugar once daily.     Endocrinology: Diabetes - Testing Supplies Passed - 12/11/2021 10:49 AM      Passed - Valid encounter within last 12 months    Recent Outpatient Visits          2 months ago Type 2 diabetes mellitus with hyperglycemia, without long-term current use of insulin [facility])   [facility] [place], [employer], [place]   6 months ago Environmental allergies   [place] [facility] [place], [place] W, NP   10 months ago Type 2 diabetes mellitus with hyperglycemia, without long-term current use of insulin [facility])   [place] [facility] [place], [place] W, NP   1 year ago Type 2 diabetes mellitus with hyperglycemia, without long-term current use of insulin [facility])   [facility] [place], [place], [place]   1 year ago Type 2 diabetes mellitus with hyperglycemia, without long-term current use of insulin [facility])   [place] [facility] [place], [place] W, NP             . TRUEplus Lancets 28G MISC 100 each 0    Sig: USE AS DIRECTED 2 TIMES DAILY     Endocrinology: Diabetes - Testing Supplies Passed - 12/11/2021 10:49 AM      Passed - Valid encounter within last 12 months    Recent Outpatient Visits          2 months ago Type 2 diabetes mellitus with hyperglycemia, without long-term current use of insulin [facility])   [facility] [place], [place], [place]   6 months ago Environmental allergies   [place] [facility] [place], [place] W, NP   10 months ago Type 2 diabetes mellitus with hyperglycemia, without long-term current use of insulin [facility])   [place] [facility] [place], [place] W, NP   1 year ago Type 2  diabetes mellitus with hyperglycemia, without long-term current use of insulin [facility])   [facility] [place], [place], [place]   1 year ago Type 2 diabetes mellitus with hyperglycemia, without long-term current use of insulin [facility])   [facility] [place], Shea Stakes, NP

## 2021-12-18 ENCOUNTER — Other Ambulatory Visit: Payer: Self-pay

## 2021-12-24 ENCOUNTER — Other Ambulatory Visit: Payer: Self-pay

## 2021-12-25 ENCOUNTER — Other Ambulatory Visit: Payer: Self-pay

## 2022-01-07 ENCOUNTER — Other Ambulatory Visit: Payer: Self-pay

## 2022-02-12 ENCOUNTER — Other Ambulatory Visit: Payer: Self-pay

## 2022-03-08 ENCOUNTER — Other Ambulatory Visit: Payer: Self-pay

## 2022-03-26 ENCOUNTER — Other Ambulatory Visit: Payer: Self-pay | Admitting: Family Medicine

## 2022-03-26 DIAGNOSIS — E1165 Type 2 diabetes mellitus with hyperglycemia: Secondary | ICD-10-CM

## 2022-03-29 ENCOUNTER — Other Ambulatory Visit: Payer: Self-pay

## 2022-03-29 MED ORDER — TRUEPLUS LANCETS 28G MISC
0 refills | Status: DC
  Filled 2022-03-29: qty 100, 90d supply, fill #0

## 2022-03-29 MED ORDER — TRUE METRIX BLOOD GLUCOSE TEST VI STRP
ORAL_STRIP | 0 refills | Status: DC
  Filled 2022-03-29: qty 100, 90d supply, fill #0

## 2022-03-29 NOTE — Telephone Encounter (Signed)
Requested Prescriptions  Pending Prescriptions Disp Refills  . TRUEplus Lancets 28G MISC 100 each 0    Sig: USE AS DIRECTED ONCE DAILY     Endocrinology: Diabetes - Testing Supplies Passed - 03/26/2022  6:44 PM      Passed - Valid encounter within last 12 months    Recent Outpatient Visits          5 months ago Type 2 diabetes mellitus with hyperglycemia, without long-term current use of insulin [facility])   Isabella Joyce, [place], [place]   10 months ago Environmental allergies   Isabella Joyce, [place] W, NP   1 year ago Type 2 diabetes mellitus with hyperglycemia, without long-term current use of insulin [facility])   Isabella Joyce, [place] W, NP   1 year ago Type 2 diabetes mellitus with hyperglycemia, without long-term current use of insulin [facility])   Isabella Joyce, [place], [place]   1 year ago Type 2 diabetes mellitus with hyperglycemia, without long-term current use of insulin [facility])   Isabella Joyce, Vernia Buff, NP      Future Appointments            In 1 month [name], [name], PA-C [place]   In 1 month [name], Vernia Buff, NP [place]           . glucose blood (TRUE METRIX BLOOD GLUCOSE TEST) test strip 100 each 0    Sig: Use to check blood sugar once daily. (Must have office visit for refills)     Endocrinology: Diabetes - Testing Supplies Passed - 03/26/2022  6:44 PM      Passed - Valid encounter within last 12 months    Recent Outpatient Visits          5 months ago Type 2 diabetes mellitus with hyperglycemia, without long-term current use of insulin [facility])   Isabella Joyce, [place], [place]   10 months ago Environmental allergies   Isabella Joyce, [place] W, NP   1 year ago Type 2  diabetes mellitus with hyperglycemia, without long-term current use of insulin [facility])   Isabella Joyce, [place] W, NP   1 year ago Type 2 diabetes mellitus with hyperglycemia, without long-term current use of insulin [facility])   Isabella Joyce, [place], [place]   1 year ago Type 2 diabetes mellitus with hyperglycemia, without long-term current use of insulin [facility])   Isabella Joyce, Vernia Buff, NP      Future Appointments            In 1 month [place], [name], PA-C Isabella Joyce   In 1 month Isabella Pounds, NP [place]

## 2022-04-07 ENCOUNTER — Other Ambulatory Visit: Payer: Self-pay | Admitting: Physician Assistant

## 2022-04-07 ENCOUNTER — Other Ambulatory Visit: Payer: Self-pay

## 2022-04-07 DIAGNOSIS — I10 Essential (primary) hypertension: Secondary | ICD-10-CM

## 2022-04-07 DIAGNOSIS — E1165 Type 2 diabetes mellitus with hyperglycemia: Secondary | ICD-10-CM

## 2022-04-07 MED ORDER — GLIMEPIRIDE 4 MG PO TABS
4.0000 mg | ORAL_TABLET | Freq: Every day | ORAL | 0 refills | Status: DC
  Filled 2022-04-07: qty 30, 30d supply, fill #0

## 2022-04-07 MED ORDER — DILTIAZEM HCL ER COATED BEADS 180 MG PO CP24
ORAL_CAPSULE | ORAL | 0 refills | Status: DC
  Filled 2022-04-07: qty 30, 30d supply, fill #0

## 2022-04-07 MED ORDER — LISINOPRIL 40 MG PO TABS
ORAL_TABLET | Freq: Every day | ORAL | 0 refills | Status: DC
  Filled 2022-04-07: qty 30, 30d supply, fill #0

## 2022-04-08 ENCOUNTER — Other Ambulatory Visit: Payer: Self-pay

## 2022-04-09 ENCOUNTER — Other Ambulatory Visit: Payer: Self-pay

## 2022-04-28 ENCOUNTER — Encounter: Payer: Self-pay | Admitting: Physician Assistant

## 2022-04-28 ENCOUNTER — Other Ambulatory Visit: Payer: Self-pay

## 2022-04-28 ENCOUNTER — Ambulatory Visit: Payer: Self-pay | Attending: Physician Assistant | Admitting: Physician Assistant

## 2022-04-28 VITALS — BP 132/84 | HR 79 | Temp 98.2°F | Ht 65.0 in | Wt 177.0 lb

## 2022-04-28 DIAGNOSIS — E1169 Type 2 diabetes mellitus with other specified complication: Secondary | ICD-10-CM

## 2022-04-28 DIAGNOSIS — I10 Essential (primary) hypertension: Secondary | ICD-10-CM

## 2022-04-28 DIAGNOSIS — H9190 Unspecified hearing loss, unspecified ear: Secondary | ICD-10-CM

## 2022-04-28 DIAGNOSIS — J069 Acute upper respiratory infection, unspecified: Secondary | ICD-10-CM

## 2022-04-28 DIAGNOSIS — E785 Hyperlipidemia, unspecified: Secondary | ICD-10-CM

## 2022-04-28 DIAGNOSIS — Z23 Encounter for immunization: Secondary | ICD-10-CM

## 2022-04-28 DIAGNOSIS — E1165 Type 2 diabetes mellitus with hyperglycemia: Secondary | ICD-10-CM

## 2022-04-28 LAB — POCT GLYCOSYLATED HEMOGLOBIN (HGB A1C): HbA1c, POC (controlled diabetic range): 6.5 % (ref 0.0–7.0)

## 2022-04-28 LAB — GLUCOSE, POCT (MANUAL RESULT ENTRY): POC Glucose: 205 mg/dl — AB (ref 70–99)

## 2022-04-28 MED ORDER — HYDROCHLOROTHIAZIDE 12.5 MG PO CAPS
12.5000 mg | ORAL_CAPSULE | Freq: Every day | ORAL | 3 refills | Status: DC
Start: 2022-04-28 — End: 2022-08-30
  Filled 2022-04-28: qty 30, 30d supply, fill #0
  Filled 2022-05-10 – 2022-05-11 (×2): qty 90, 90d supply, fill #0
  Filled 2022-08-24: qty 90, 90d supply, fill #1

## 2022-04-28 MED ORDER — DILTIAZEM HCL ER COATED BEADS 180 MG PO CP24
180.0000 mg | ORAL_CAPSULE | Freq: Every day | ORAL | 0 refills | Status: DC
  Filled 2022-04-28: qty 30, fill #0
  Filled 2022-05-10: qty 30, 30d supply, fill #0

## 2022-04-28 MED ORDER — GLIMEPIRIDE 4 MG PO TABS
4.0000 mg | ORAL_TABLET | Freq: Every day | ORAL | 1 refills | Status: DC
  Filled 2022-04-28 – 2022-05-10 (×2): qty 90, 90d supply, fill #0
  Filled 2022-08-05: qty 30, 30d supply, fill #1

## 2022-04-28 MED ORDER — ATORVASTATIN CALCIUM 40 MG PO TABS
40.0000 mg | ORAL_TABLET | Freq: Every day | ORAL | 3 refills | Status: DC
  Filled 2022-04-28 – 2022-05-10 (×2): qty 90, 90d supply, fill #0

## 2022-04-28 MED ORDER — LISINOPRIL 40 MG PO TABS
40.0000 mg | ORAL_TABLET | Freq: Every day | ORAL | 1 refills | Status: DC
Start: 2022-04-28 — End: 2022-08-30
  Filled 2022-04-28: qty 90, fill #0
  Filled 2022-05-10: qty 90, 90d supply, fill #0
  Filled 2022-08-05: qty 30, 30d supply, fill #1

## 2022-04-28 MED ORDER — METFORMIN HCL ER 500 MG PO TB24
1000.0000 mg | ORAL_TABLET | Freq: Two times a day (BID) | ORAL | 1 refills | Status: DC
  Filled 2022-04-28: qty 120, 30d supply, fill #0

## 2022-04-28 NOTE — Progress Notes (Signed)
Patient ID: Isabella Joyce, female   DOB: 11-07-1970, 51 y.o.   MRN: [id number]   Isabella Joyce, is a 51 y.o. female  DXI:[id number]  [id number]:[id number]  DOB - 1971-03-11  Chief Complaint  Patient presents with   Diabetes    DM f/u. Med refill.  Sore throat X2 days. Yes to flu vax.        Subjective:   Isabella Joyce is a 51 y.o. female here today for med RF.  She is compliant with meds.  Not checking blood sugars.    She also has nsal congestion and sneezing with mild ST for about 2 days.  No fever.  She is requesting flu shot.  Minimal cough.  No SOB.  No GI s/sx   No problems updated.  ALLERGIES: Allergies  Allergen Reactions   Pollen Extract     Hear swelling    PAST MEDICAL HISTORY: Past Medical History:  Diagnosis Date   COVID-19 virus infection 12/12/2018   Diabetes mellitus without complication ([place]) 4097   Hearing deficit    Hyperlipidemia    Hypertension 2008    MEDICATIONS AT HOME: Prior to Admission medications   Medication Sig Start Date End Date Taking? Authorizing Provider  Blood Glucose Monitoring Suppl (TRUE METRIX METER) w/Device KIT USE AS DIRECTED 2 TIMES DAILY 01/17/20  Yes [name], NP  glucose blood (TRUE METRIX BLOOD GLUCOSE TEST) test strip Use to check blood sugar once daily. (Must have office visit for refills) 03/29/22  Yes [place], Enobong, MD  loratadine (CLARITIN) 10 MG tablet Take 1 tablet by mouth daily. 05/26/21  Yes [name], NP  TRUEplus Lancets 28G MISC USE AS DIRECTED ONCE DAILY 03/29/22  Yes Charlott Rakes, MD  atorvastatin (LIPITOR) 40 MG tablet TAKE 1 TABLET (40 MG TOTAL) BY MOUTH DAILY AT 6 PM. 04/28/22 04/28/23  Argentina Donovan, PA-C  diltiazem (CARDIZEM CD) 180 MG 24 hr capsule TAKE 1 CAPSULE (180 MG TOTAL) BY MOUTH DAILY. 04/28/22   Argentina Donovan, PA-C  glimepiride (AMARYL) 4 MG tablet Take 1 tablet (4 mg total) by mouth daily before breakfast. 04/28/22   Thereasa Solo, [name], PA-C   hydrochlorothiazide (MICROZIDE) 12.5 MG capsule Take 1 capsule (12.5 mg total) by mouth daily. 04/28/22   Argentina Donovan, PA-C  lisinopril (ZESTRIL) 40 MG tablet TAKE 1 TABLET (40 MG TOTAL) BY MOUTH DAILY. 04/28/22   Argentina Donovan, PA-C  metFORMIN (GLUCOPHAGE-XR) 500 MG 24 hr tablet TAKE 2 TABLETS BY MOUTH TWICE A DAY AT MEALS 04/28/22 04/28/23  Hiroshi Krummel, [name], PA-C    ROS: Neg HEENT Neg resp Neg cardiac Neg GI Neg GU Neg MS Neg psych Neg neuro  Objective:   Vitals:   04/28/22 1126  BP: (!) 157/94  Pulse: 79  Temp: 98.2 F (36.8 C)  TempSrc: Oral  SpO2: 97%  Weight: 177 lb (80.3 kg)  Height: _0  (1.651 m)   Exam General appearance : Awake, alert, not in any distress. Speech Clear. Not toxic looking HEENT: Atraumatic and Normocephalic.  Gross hearing is very poor.  I have the interpreter screen right up against her ear and she still has a hard time hearing.  Throat with PND, no exudate Neck: Supple, no JVD. No cervical lymphadenopathy.  Chest: Good air entry bilaterally, CTAB.  No rales/rhonchi/wheezing CVS: S1 S2 regular, no murmurs.  Extremities: B/L Lower Ext shows no edema, both legs are warm to touch Neurology: Awake alert, and oriented X 3, CN II-XII  intact, Non focal Skin: No Rash  Data Review Lab Results  Component Value Date   HGBA1C 6.5 04/28/2022   HGBA1C 7.4 (A) 10/07/2021   HGBA1C 7.2 (H) 01/16/2021    Assessment & Plan   1. Type 2 diabetes mellitus with hyperglycemia, without long-term current use of insulin (HCC) Improved-at goal.  Continue current regimen - Glucose (CBG) - HgB A1c - Comprehensive metabolic panel - CBC with Differential/Platelet - glimepiride (AMARYL) 4 MG tablet; Take 1 tablet (4 mg total) by mouth daily before breakfast.  Dispense: 90 tablet; Refill: 1 - metFORMIN (GLUCOPHAGE-XR) 500 MG 24 hr tablet; TAKE 2 TABLETS BY MOUTH TWICE A DAY AT MEALS  Dispense: 360 tablet; Refill: 1  2. Essential hypertension Ok on  second check 132/84.  Advised DASH diet - Comprehensive metabolic panel - CBC with Differential/Platelet - diltiazem (CARDIZEM CD) 180 MG 24 hr capsule; TAKE 1 CAPSULE (180 MG TOTAL) BY MOUTH DAILY.  Dispense: 30 capsule; Refill: 0 - lisinopril (ZESTRIL) 40 MG tablet; TAKE 1 TABLET (40 MG TOTAL) BY MOUTH DAILY.  Dispense: 90 tablet; Refill: 1 - hydrochlorothiazide (MICROZIDE) 12.5 MG capsule; Take 1 capsule (12.5 mg total) by mouth daily.  Dispense: 90 capsule; Refill: 3  3. Hyperlipidemia associated with type 2 diabetes mellitus (HCC) - Lipid panel - atorvastatin (LIPITOR) 40 MG tablet; TAKE 1 TABLET (40 MG TOTAL) BY MOUTH DAILY AT 6 PM.  Dispense: 90 tablet; Refill: 3  4. Decreased hearing, unspecified laterality She previously had a hearin aid but no longer has it/wears it.   - Ambulatory referral to ENT  5. URI, acute Fluids, rest, respiratory care - Novel Coronavirus, NAA ([facility]) [employer] "[place]" interpreters used and additional time performing visit was required.   Return in about 4 months (around 08/27/2022) for PCP for chronic conditions.  The patient was given clear instructions to go to ER or return to medical center if symptoms don't improve, worsen or new problems develop. The patient verbalized understanding. The patient was told to call to get lab results if they haven't heard anything in the next week.      Freeman Caldron, PA-C [facility] and [facility] [place], [place]   04/28/2022, 11:44 AM

## 2022-04-29 LAB — CBC WITH DIFFERENTIAL/PLATELET
Basophils Absolute: 0 10*3/uL (ref 0.0–0.2)
Basos: 0 %
EOS (ABSOLUTE): 0.2 10*3/uL (ref 0.0–0.4)
Eos: 3 %
Hematocrit: 43.5 % (ref 34.0–46.6)
Hemoglobin: 14.7 g/dL (ref 11.1–15.9)
Immature Grans (Abs): 0 10*3/uL (ref 0.0–0.1)
Immature Granulocytes: 0 %
Lymphocytes Absolute: 0.8 10*3/uL (ref 0.7–3.1)
Lymphs: 15 %
MCH: 31.8 pg (ref 26.6–33.0)
MCHC: 33.8 g/dL (ref 31.5–35.7)
MCV: 94 fL (ref 79–97)
Monocytes Absolute: 0.7 10*3/uL (ref 0.1–0.9)
Monocytes: 13 %
Neutrophils Absolute: 3.8 10*3/uL (ref 1.4–7.0)
Neutrophils: 69 %
Platelets: 209 10*3/uL (ref 150–450)
RBC: 4.62 x10E6/uL (ref 3.77–5.28)
RDW: 12.7 % (ref 11.7–15.4)
WBC: 5.5 10*3/uL (ref 3.4–10.8)

## 2022-04-29 LAB — NOVEL CORONAVIRUS, NAA: SARS-CoV-2, NAA: NOT DETECTED

## 2022-04-29 LAB — LIPID PANEL
Chol/HDL Ratio: 2.7 ratio (ref 0.0–4.4)
Cholesterol, Total: 104 mg/dL (ref 100–199)
HDL: 39 mg/dL — ABNORMAL LOW (ref >39)
LDL Chol Calc (NIH): 50 mg/dL (ref 0–99)
Triglycerides: 70 mg/dL (ref 0–149)
VLDL Cholesterol Cal: 15 mg/dL (ref 5–40)

## 2022-04-29 LAB — COMPREHENSIVE METABOLIC PANEL
ALT: 20 IU/L (ref 0–32)
AST: 19 IU/L (ref 0–40)
Albumin/Globulin Ratio: 1.5 (ref 1.2–2.2)
Albumin: 4.2 g/dL (ref 3.8–4.9)
Alkaline Phosphatase: 94 IU/L (ref 44–121)
BUN/Creatinine Ratio: 12 (ref 9–23)
BUN: 8 mg/dL (ref 6–24)
Bilirubin Total: 0.3 mg/dL (ref 0.0–1.2)
CO2: 23 mmol/L (ref 20–29)
Calcium: 9.1 mg/dL (ref 8.7–10.2)
Chloride: 101 mmol/L (ref 96–106)
Creatinine, Ser: 0.69 mg/dL (ref 0.57–1.00)
Globulin, Total: 2.8 g/dL (ref 1.5–4.5)
Glucose: 170 mg/dL — ABNORMAL HIGH (ref 70–99)
Potassium: 3.8 mmol/L (ref 3.5–5.2)
Sodium: 139 mmol/L (ref 134–144)
Total Protein: 7 g/dL (ref 6.0–8.5)
eGFR: 105 mL/min/{1.73_m2} (ref >59)

## 2022-05-05 ENCOUNTER — Other Ambulatory Visit: Payer: Self-pay

## 2022-05-10 ENCOUNTER — Other Ambulatory Visit: Payer: Self-pay

## 2022-05-10 ENCOUNTER — Other Ambulatory Visit: Payer: Self-pay | Admitting: Family Medicine

## 2022-05-10 DIAGNOSIS — E1165 Type 2 diabetes mellitus with hyperglycemia: Secondary | ICD-10-CM

## 2022-05-10 MED ORDER — TRUE METRIX BLOOD GLUCOSE TEST VI STRP
ORAL_STRIP | 0 refills | Status: DC
  Filled 2022-05-10: qty 100, fill #0
  Filled 2022-07-21: qty 100, 90d supply, fill #0

## 2022-05-10 MED ORDER — TRUEPLUS LANCETS 28G MISC
0 refills | Status: DC
  Filled 2022-05-10: qty 100, fill #0
  Filled 2022-07-21: qty 100, 90d supply, fill #0

## 2022-05-11 ENCOUNTER — Encounter: Payer: Self-pay | Admitting: Nurse Practitioner

## 2022-05-11 ENCOUNTER — Other Ambulatory Visit: Payer: Self-pay

## 2022-05-11 ENCOUNTER — Ambulatory Visit: Payer: Self-pay | Attending: Nurse Practitioner | Admitting: Nurse Practitioner

## 2022-05-11 VITALS — Temp 98.0°F | Ht 65.0 in | Wt 178.0 lb

## 2022-05-11 DIAGNOSIS — Z0000 Encounter for general adult medical examination without abnormal findings: Secondary | ICD-10-CM

## 2022-05-11 DIAGNOSIS — J069 Acute upper respiratory infection, unspecified: Secondary | ICD-10-CM

## 2022-05-11 DIAGNOSIS — K5909 Other constipation: Secondary | ICD-10-CM

## 2022-05-11 MED ORDER — AZITHROMYCIN 250 MG PO TABS
ORAL_TABLET | ORAL | 0 refills | Status: AC
  Filled 2022-05-11: qty 6, 5d supply, fill #0

## 2022-05-11 MED ORDER — SENNOSIDES-DOCUSATE SODIUM 8.6-50 MG PO TABS
1.0000 | ORAL_TABLET | Freq: Every day | ORAL | 3 refills | Status: DC
  Filled 2022-05-11: qty 60, 30d supply, fill #0

## 2022-05-11 MED ORDER — PSEUDOEPH-BROMPHEN-DM 30-2-10 MG/5ML PO SYRP
5.0000 mL | ORAL_SOLUTION | Freq: Four times a day (QID) | ORAL | 0 refills | Status: DC | PRN
  Filled 2022-05-11: qty 240, 12d supply, fill #0

## 2022-05-11 NOTE — Progress Notes (Signed)
Assessment & Plan:  Isabella Joyce was seen today for hypertension and annual exam.  Diagnoses and all orders for this visit:  Encounter for annual physical exam  URI with cough and congestion -     azithromycin (ZITHROMAX) 250 MG tablet; Take 2 tablets on day 1, then 1 tablet daily on days 2 through 5 -     brompheniramine-pseudoephedrine-DM 30-2-10 MG/5ML syrup; Take 5 mLs by mouth 4 (four) times daily as needed.  Chronic constipation -     senna-docusate (SENOKOT-S) 8.6-50 MG tablet; Take 1-2 tablets by mouth daily. For constipation    Patient has been counseled on age-appropriate routine health concerns for screening and prevention. These are reviewed and up-to-date. Referrals have been placed accordingly. Immunizations are up-to-date or declined.    Subjective:   Chief Complaint  Patient presents with   Hypertension   Annual Exam   HPI Isabella Joyce 51 y.o. female presents to office today for annual physical exam.   Upper Respiratory Infection: Patient complains of symptoms of a URI. Symptoms include runny nose,  congestion, cough, plugged sensation in both ears, sore throat, and swollen glands. Onset of symptoms was several weeks ago, unchanged since that time. She denies fever  , low grade fever, purulent nasal discharge, and rash  Evaluation to date: none. Treatment to date:  "home remedies" .    Review of Systems  Constitutional:  Negative for chills, diaphoresis, fever, malaise/fatigue and weight loss.  HENT:  Positive for congestion and sore throat. Negative for nosebleeds.   Eyes: Negative.  Negative for blurred vision, double vision and photophobia.  Respiratory:  Positive for cough and sputum production. Negative for shortness of breath and wheezing.   Cardiovascular: Negative.  Negative for chest pain, palpitations and leg swelling.  Gastrointestinal:  Positive for constipation. Negative for abdominal pain, blood in stool, diarrhea, heartburn, melena, nausea  and vomiting.  Genitourinary: Negative.   Musculoskeletal: Negative.  Negative for myalgias.  Skin: Negative.   Neurological: Negative.  Negative for dizziness, focal weakness, seizures and headaches.  Endo/Heme/Allergies: Negative.   Psychiatric/Behavioral: Negative.  Negative for suicidal ideas.     Past Medical History:  Diagnosis Date   COVID-19 virus infection 12/12/2018   Diabetes mellitus without complication ([place]) 3149   Hearing deficit    Hyperlipidemia    Hypertension 2008    History reviewed. No pertinent surgical history.  Family History  Problem Relation Age of Onset   Diabetes Mother    Hypertension Mother    Diabetes Father    Hypertension Father    Cancer Neg Hx    Heart disease Neg Hx     Social History Reviewed with no changes to be made today.   Outpatient Medications Prior to Visit  Medication Sig Dispense Refill   atorvastatin (LIPITOR) 40 MG tablet Take 1 tablet (40 mg total) by mouth daily at 6 PM. 90 tablet 3   Blood Glucose Monitoring Suppl (TRUE METRIX METER) w/Device KIT USE AS DIRECTED 2 TIMES DAILY 1 kit 0   diltiazem (CARDIZEM CD) 180 MG 24 hr capsule Take 1 capsule (180 mg total) by mouth daily. 30 capsule 0   glimepiride (AMARYL) 4 MG tablet Take 1 tablet (4 mg total) by mouth daily before breakfast. 90 tablet 1   glucose blood (TRUE METRIX BLOOD GLUCOSE TEST) test strip Use to check blood sugar once daily. (Must have office visit for refills) 100 each 0   hydrochlorothiazide (MICROZIDE) 12.5 MG capsule Take 1 capsule (12.5 mg  total) by mouth daily. 90 capsule 3   lisinopril (ZESTRIL) 40 MG tablet Take 1 tablet (40 mg total) by mouth daily. 90 tablet 1   loratadine (CLARITIN) 10 MG tablet Take 1 tablet by mouth daily. 30 tablet 11   metFORMIN (GLUCOPHAGE-XR) 500 MG 24 hr tablet Take 2 tablets (1,000 mg total) by mouth 2 (two) times daily with a meal. 360 tablet 1   TRUEplus Lancets 28G MISC USE AS DIRECTED ONCE DAILY 100 each 0   No  facility-administered medications prior to visit.    Allergies  Allergen Reactions   Pollen Extract     Hear swelling       Objective:    Temp 98 F (36.7 C)   Ht _0  (1.651 m)   Wt 178 lb (80.7 kg)   SpO2 98%   BMI 29.62 kg/m  Wt Readings from Last 3 Encounters:  05/11/22 178 lb (80.7 kg)  04/28/22 177 lb (80.3 kg)  10/07/21 184 lb 6.4 oz (83.6 kg)    Physical Exam Constitutional:      Appearance: She is well-developed.  HENT:     Head: Normocephalic and atraumatic.     Right Ear: Hearing, ear canal and external ear normal. A middle ear effusion is present.     Left Ear: Hearing, ear canal and external ear normal. A middle ear effusion is present.     Nose: Nose normal.     Right Turbinates: Not enlarged, swollen or pale.     Left Turbinates: Not enlarged, swollen or pale.     Right Sinus: No maxillary sinus tenderness or frontal sinus tenderness.     Left Sinus: No maxillary sinus tenderness or frontal sinus tenderness.     Mouth/Throat:     Lips: Pink.     Mouth: Mucous membranes are moist.     Dentition: No dental tenderness, gingival swelling, dental abscesses or gum lesions.     Pharynx: No oropharyngeal exudate.  Eyes:     General: No scleral icterus.       Right eye: No discharge.     Extraocular Movements: Extraocular movements intact.     Conjunctiva/sclera: Conjunctivae normal.     Pupils: Pupils are equal, round, and reactive to light.  Neck:     Thyroid: No thyromegaly.     Trachea: No tracheal deviation.  Cardiovascular:     Rate and Rhythm: Normal rate and regular rhythm.     Heart sounds: Normal heart sounds. No murmur heard.    No friction rub.  Pulmonary:     Effort: Pulmonary effort is normal. No accessory muscle usage or respiratory distress.     Breath sounds: Normal breath sounds. No decreased breath sounds, wheezing, rhonchi or rales.  Abdominal:     General: Bowel sounds are normal. There is no distension.     Palpations: Abdomen  is soft. There is no mass.     Tenderness: There is no abdominal tenderness. There is no right CVA tenderness, left CVA tenderness, guarding or rebound.     Hernia: No hernia is present.  Musculoskeletal:        General: No tenderness or deformity. Normal range of motion.     Cervical back: Normal range of motion and neck supple.  Lymphadenopathy:     Cervical: No cervical adenopathy.  Skin:    General: Skin is warm and dry.     Findings: No erythema.  Neurological:     Mental Status: She is alert and  oriented to person, place, and time.     Cranial Nerves: No cranial nerve deficit.     Motor: Motor function is intact.     Coordination: Coordination is intact. Coordination normal.     Gait: Gait is intact.     Deep Tendon Reflexes:     Reflex Scores:      Patellar reflexes are 1+ on the right side and 1+ on the left side. Psychiatric:        Attention and Perception: Attention normal.        Mood and Affect: Mood normal.        Speech: Speech normal.        Behavior: Behavior normal.        Thought Content: Thought content normal.        Judgment: Judgment normal.          Patient has been counseled extensively about nutrition and exercise as well as the importance of adherence with medications and regular follow-up. The patient was given clear instructions to go to ER or return to medical center if symptoms don't improve, worsen or new problems develop. The patient verbalized understanding.   Follow-up: Return in about 3 months (around 08/11/2022).   [name], FNP-BC [facility] and [place], [place]   05/11/2022, 3:12 PM

## 2022-06-10 ENCOUNTER — Other Ambulatory Visit: Payer: Self-pay | Admitting: Physician Assistant

## 2022-06-10 ENCOUNTER — Other Ambulatory Visit: Payer: Self-pay

## 2022-06-10 DIAGNOSIS — I10 Essential (primary) hypertension: Secondary | ICD-10-CM

## 2022-06-10 MED ORDER — DILTIAZEM HCL ER COATED BEADS 180 MG PO CP24
180.0000 mg | ORAL_CAPSULE | Freq: Every day | ORAL | 2 refills | Status: DC
  Filled 2022-06-10: qty 30, 30d supply, fill #0
  Filled 2022-07-06 (×2): qty 30, 30d supply, fill #1
  Filled 2022-08-05: qty 30, 30d supply, fill #2

## 2022-06-11 ENCOUNTER — Other Ambulatory Visit: Payer: Self-pay

## 2022-07-06 ENCOUNTER — Other Ambulatory Visit: Payer: Self-pay

## 2022-07-08 ENCOUNTER — Other Ambulatory Visit: Payer: Self-pay

## 2022-07-21 ENCOUNTER — Other Ambulatory Visit: Payer: Self-pay

## 2022-08-05 ENCOUNTER — Other Ambulatory Visit: Payer: Self-pay

## 2022-08-24 ENCOUNTER — Other Ambulatory Visit: Payer: Self-pay

## 2022-08-25 ENCOUNTER — Other Ambulatory Visit: Payer: Self-pay

## 2022-08-30 ENCOUNTER — Ambulatory Visit: Payer: Self-pay | Attending: Nurse Practitioner | Admitting: Nurse Practitioner

## 2022-08-30 ENCOUNTER — Other Ambulatory Visit: Payer: Self-pay

## 2022-08-30 ENCOUNTER — Encounter: Payer: Self-pay | Admitting: Nurse Practitioner

## 2022-08-30 VITALS — BP 157/93 | HR 90 | Ht 65.0 in | Wt 167.6 lb

## 2022-08-30 DIAGNOSIS — E1169 Type 2 diabetes mellitus with other specified complication: Secondary | ICD-10-CM

## 2022-08-30 DIAGNOSIS — E1165 Type 2 diabetes mellitus with hyperglycemia: Secondary | ICD-10-CM

## 2022-08-30 DIAGNOSIS — F32A Depression, unspecified: Secondary | ICD-10-CM

## 2022-08-30 DIAGNOSIS — E785 Hyperlipidemia, unspecified: Secondary | ICD-10-CM

## 2022-08-30 DIAGNOSIS — I10 Essential (primary) hypertension: Secondary | ICD-10-CM

## 2022-08-30 DIAGNOSIS — F419 Anxiety disorder, unspecified: Secondary | ICD-10-CM

## 2022-08-30 DIAGNOSIS — Z1211 Encounter for screening for malignant neoplasm of colon: Secondary | ICD-10-CM

## 2022-08-30 MED ORDER — PAROXETINE HCL 10 MG PO TABS
10.0000 mg | ORAL_TABLET | Freq: Every day | ORAL | 3 refills | Status: DC
  Filled 2022-08-30: qty 30, 30d supply, fill #0

## 2022-08-30 MED ORDER — HYDROCHLOROTHIAZIDE 12.5 MG PO CAPS
12.5000 mg | ORAL_CAPSULE | Freq: Every day | ORAL | 3 refills | Status: DC
  Filled 2022-08-30: qty 90, 90d supply, fill #0
  Filled 2022-11-17: qty 30, 30d supply, fill #0
  Filled 2022-12-15: qty 30, 30d supply, fill #1
  Filled 2023-01-12: qty 30, 30d supply, fill #2
  Filled 2023-02-16: qty 30, 30d supply, fill #3
  Filled 2023-03-19: qty 30, 30d supply, fill #4
  Filled 2023-04-22: qty 30, 30d supply, fill #5
  Filled 2023-05-25: qty 30, 30d supply, fill #6
  Filled 2023-06-20: qty 30, 30d supply, fill #7
  Filled 2023-06-21: qty 90, 90d supply, fill #7

## 2022-08-30 MED ORDER — TRUEPLUS LANCETS 28G MISC
6 refills | Status: DC
  Filled 2022-08-30: qty 100, fill #0
  Filled 2022-10-27: qty 100, 100d supply, fill #0
  Filled 2023-02-16: qty 100, 100d supply, fill #1
  Filled 2023-06-20 – 2023-06-21 (×2): qty 100, 100d supply, fill #2

## 2022-08-30 MED ORDER — DILTIAZEM HCL ER COATED BEADS 180 MG PO CP24
180.0000 mg | ORAL_CAPSULE | Freq: Every day | ORAL | 1 refills | Status: DC
  Filled 2022-08-30: qty 90, 90d supply, fill #0
  Filled 2022-12-07: qty 90, 90d supply, fill #1

## 2022-08-30 MED ORDER — TRUE METRIX BLOOD GLUCOSE TEST VI STRP
ORAL_STRIP | 6 refills | Status: DC
  Filled 2022-08-30: qty 100, fill #0
  Filled 2022-10-26: qty 100, 100d supply, fill #0
  Filled 2023-01-12: qty 100, 100d supply, fill #1
  Filled 2023-03-19: qty 100, 100d supply, fill #2
  Filled 2023-06-20 – 2023-06-21 (×3): qty 100, 100d supply, fill #3

## 2022-08-30 MED ORDER — LISINOPRIL 40 MG PO TABS
40.0000 mg | ORAL_TABLET | Freq: Every day | ORAL | 1 refills | Status: DC
  Filled 2022-08-30: qty 90, 90d supply, fill #0
  Filled 2022-12-07: qty 90, 90d supply, fill #1

## 2022-08-30 MED ORDER — ATORVASTATIN CALCIUM 40 MG PO TABS
40.0000 mg | ORAL_TABLET | Freq: Every day | ORAL | 3 refills | Status: DC
  Filled 2022-08-30: qty 90, 90d supply, fill #0
  Filled 2023-04-04: qty 90, 90d supply, fill #1
  Filled 2023-04-04: qty 30, 30d supply, fill #1
  Filled 2023-05-02 (×2): qty 30, 30d supply, fill #2
  Filled 2023-06-02 (×2): qty 30, 30d supply, fill #3

## 2022-08-30 MED ORDER — METFORMIN HCL ER 500 MG PO TB24
1000.0000 mg | ORAL_TABLET | Freq: Two times a day (BID) | ORAL | 1 refills | Status: DC
  Filled 2022-08-30: qty 360, 90d supply, fill #0

## 2022-08-30 MED ORDER — GLIMEPIRIDE 4 MG PO TABS
4.0000 mg | ORAL_TABLET | Freq: Every day | ORAL | 1 refills | Status: DC
  Filled 2022-08-30: qty 90, 90d supply, fill #0
  Filled 2022-12-07: qty 90, 90d supply, fill #1

## 2022-08-30 NOTE — Progress Notes (Signed)
Patient states that she is experiencing anxiety and depression due to her marital issues and kids leaving home.

## 2022-08-30 NOTE — Progress Notes (Signed)
Assessment & Plan:  Isabella Joyce was seen today for depression.  Diagnoses and all orders for this visit:  Anxiety and depression -     Discontinue: PARoxetine (PAXIL) 10 MG tablet; Take 1 tablet (10 mg total) by mouth daily. -     Thyroid Panel With TSH -     PARoxetine (PAXIL) 10 MG tablet; Take 1 tablet (10 mg total) by mouth daily. FOR DEPRESSION AND HOT FLASHES  Essential hypertension -     Thyroid Panel With TSH -     lisinopril (ZESTRIL) 40 MG tablet; Take 1 tablet (40 mg total) by mouth daily. -     hydrochlorothiazide (MICROZIDE) 12.5 MG capsule; Take 1 capsule (12.5 mg total) by mouth daily. Continue all antihypertensives as prescribed.  Reminded to bring in blood pressure log for follow  up appointment.  RECOMMENDATIONS: DASH/Mediterranean Diets are healthier choices for HTN.    Type 2 diabetes mellitus with hyperglycemia, without long-term current use of insulin (HCC) -     Thyroid Panel With TSH -     Hemoglobin A1c -     glimepiride (AMARYL) 4 MG tablet; Take 1 tablet (4 mg total) by mouth daily before breakfast. -     metFORMIN (GLUCOPHAGE-XR) 500 MG 24 hr tablet; Take 2 tablets (1,000 mg total) by mouth 2 (two) times daily with a meal. -     TRUEplus Lancets 28G MISC; USE AS DIRECTED ONCE DAILY -     glucose blood (TRUE METRIX BLOOD GLUCOSE TEST) test strip; Use to check blood sugar once daily. (Must have office visit for refills) Continue blood sugar control as discussed in office today, low carbohydrate diet, and regular physical exercise as tolerated, 150 minutes per week (30 min each day, 5 days per week, or 50 min 3 days per week). Keep blood sugar logs with fasting goal of 90-130 mg/dl, post prandial (after you eat) less than 180.  For Hypoglycemia: BS <60 and Hyperglycemia BS >400; contact the clinic ASAP. Annual eye exams and foot exams are recommended.   Hyperlipidemia associated with type 2 diabetes mellitus (HCC) -     atorvastatin (LIPITOR) 40 MG tablet; Take  1 tablet (40 mg total) by mouth daily at 6 PM. FOR CHOLESTEROL INSTRUCTIONS: Work on a low fat, heart healthy diet and participate in regular aerobic exercise program by working out at least 150 minutes per week; 5 days a week-30 minutes per day. Avoid red meat/beef/steak,  fried foods. junk foods, sodas, sugary drinks, unhealthy snacking, alcohol and smoking.  Drink at least 80 oz of water per day and monitor your carbohydrate intake daily.     Patient has been counseled on age-appropriate routine health concerns for screening and prevention. These are reviewed and up-to-date. Referrals have been placed accordingly. Immunizations are up-to-date or declined.    Subjective:   Chief Complaint  Patient presents with   Depression   HPI Isabella Joyce 52 y.o. female presents to office today with  ANXIETY AND DEPRESSION along with concerns of menopausal symptoms including hot flashes.   VRI was used to communicate directly with patient for the entire encounter including providing detailed patient instructions  Isabella Joyce states her adult children have all moved out of the home (one to [place], youngest son moved out with his gf and daughter who used to live in the same neighborhood has now moved from across the street). Her husband works all day long and does not get home until late at night. She feels  lonely and depressed. No friends or relatives locally.  also moved from across the street. Husband works all day and gets home late at night. Symptoms of depression include tearfulness and anxiety.  She also endorses hot flashes occurring intermittently throughout the day.   Review of Systems  Constitutional:  Negative for fever, malaise/fatigue and weight loss.       Hot flashes  HENT: Negative.  Negative for nosebleeds.   Eyes: Negative.  Negative for blurred vision, double vision and photophobia.  Respiratory: Negative.  Negative for cough and shortness of breath.   Cardiovascular:  Negative.  Negative for chest pain, palpitations and leg swelling.  Gastrointestinal: Negative.  Negative for heartburn, nausea and vomiting.  Musculoskeletal: Negative.  Negative for myalgias.  Neurological: Negative.  Negative for dizziness, focal weakness, seizures and headaches.  Psychiatric/Behavioral:  Positive for depression. Negative for suicidal ideas. The patient is nervous/anxious and has insomnia.     Past Medical History:  Diagnosis Date   COVID-19 virus infection 12/12/2018   Diabetes mellitus without complication ([place]) 123XX123   Hearing deficit    Hyperlipidemia    Hypertension 2008    History reviewed. No pertinent surgical history.  Family History  Problem Relation Age of Onset   Diabetes Mother    Hypertension Mother    Diabetes Father    Hypertension Father    Cancer Neg Hx    Heart disease Neg Hx     Social History Reviewed with no changes to be made today.   Outpatient Medications Prior to Visit  Medication Sig Dispense Refill   Blood Glucose Monitoring Suppl (TRUE METRIX METER) w/Device KIT USE AS DIRECTED 2 TIMES DAILY 1 kit 0   atorvastatin (LIPITOR) 40 MG tablet Take 1 tablet (40 mg total) by mouth daily at 6 PM. 90 tablet 3   brompheniramine-pseudoephedrine-DM 30-2-10 MG/5ML syrup Take 5 mLs by mouth 4 (four) times daily as needed. 240 mL 0   diltiazem (CARDIZEM CD) 180 MG 24 hr capsule Take 1 capsule (180 mg total) by mouth daily. 30 capsule 2   glimepiride (AMARYL) 4 MG tablet Take 1 tablet (4 mg total) by mouth daily before breakfast. 90 tablet 1   glucose blood (TRUE METRIX BLOOD GLUCOSE TEST) test strip Use to check blood sugar once daily. (Must have office visit for refills) 100 each 0   hydrochlorothiazide (MICROZIDE) 12.5 MG capsule Take 1 capsule (12.5 mg total) by mouth daily. 90 capsule 3   lisinopril (ZESTRIL) 40 MG tablet Take 1 tablet (40 mg total) by mouth daily. 90 tablet 1   loratadine (CLARITIN) 10 MG tablet Take 1 tablet by mouth daily.  30 tablet 11   metFORMIN (GLUCOPHAGE-XR) 500 MG 24 hr tablet Take 2 tablets (1,000 mg total) by mouth 2 (two) times daily with a meal. 360 tablet 1   senna-docusate (SENOKOT-S) 8.6-50 MG tablet Take 1-2 tablets by mouth daily. For constipation 60 tablet 3   TRUEplus Lancets 28G MISC USE AS DIRECTED ONCE DAILY 100 each 0   No facility-administered medications prior to visit.    Allergies  Allergen Reactions   Pollen Extract     Hear swelling       Objective:    BP (!) 157/93   Pulse 90   Ht 5\' 5"  (1.651 m)   Wt 167 lb 9.6 oz (76 kg)   SpO2 98%   BMI 27.89 kg/m  Wt Readings from Last 3 Encounters:  08/30/22 167 lb 9.6 oz (76 kg)  05/11/22  178 lb (80.7 kg)  04/28/22 177 lb (80.3 kg)    Physical Exam Vitals and nursing note reviewed.  Constitutional:      Appearance: She is well-developed.  HENT:     Head: Normocephalic and atraumatic.  Cardiovascular:     Rate and Rhythm: Normal rate and regular rhythm.     Heart sounds: Normal heart sounds. No murmur heard.    No friction rub. No gallop.  Pulmonary:     Effort: Pulmonary effort is normal. No tachypnea or respiratory distress.     Breath sounds: Normal breath sounds. No decreased breath sounds, wheezing, rhonchi or rales.  Chest:     Chest wall: No tenderness.  Abdominal:     General: Bowel sounds are normal.     Palpations: Abdomen is soft.  Musculoskeletal:        General: Normal range of motion.     Cervical back: Normal range of motion.  Skin:    General: Skin is warm and dry.  Neurological:     Mental Status: She is alert and oriented to person, place, and time.     Coordination: Coordination normal.  Psychiatric:        Mood and Affect: Affect is tearful.        Behavior: Behavior is cooperative.        Thought Content: Thought content normal.        Cognition and Memory: Cognition normal.        Judgment: Judgment normal.          Patient has been counseled extensively about nutrition and  exercise as well as the importance of adherence with medications and regular follow-up. The patient was given clear instructions to go to ER or return to medical center if symptoms don't improve, worsen or new problems develop. The patient verbalized understanding.   Follow-up: Return in about 4 weeks (around 09/27/2022) for anxiety and depression.   [name], FNP-BC [facility] and [place], [place]   08/30/2022, 7:31 PM

## 2022-08-31 LAB — HEMOGLOBIN A1C
Est. average glucose Bld gHb Est-mCnc: 169 mg/dL
Hgb A1c MFr Bld: 7.5 % — ABNORMAL HIGH (ref 4.8–5.6)

## 2022-08-31 LAB — THYROID PANEL WITH TSH
Free Thyroxine Index: 1.8 (ref 1.2–4.9)
T3 Uptake Ratio: 27 % (ref 24–39)
T4, Total: 6.7 ug/dL (ref 4.5–12.0)
TSH: 1.14 u[IU]/mL (ref 0.450–4.500)

## 2022-08-31 LAB — MICROALBUMIN / CREATININE URINE RATIO
Creatinine, Urine: 20.4 mg/dL
Microalb/Creat Ratio: <15 mg/g creat (ref 0–29)
Microalbumin, Urine: <3 ug/mL

## 2022-09-01 ENCOUNTER — Encounter: Payer: Self-pay | Admitting: Nurse Practitioner

## 2022-09-01 DIAGNOSIS — Z1231 Encounter for screening mammogram for malignant neoplasm of breast: Secondary | ICD-10-CM

## 2022-09-16 LAB — SPECIMEN STATUS REPORT

## 2022-09-16 LAB — FECAL OCCULT BLOOD, IMMUNOCHEMICAL: Fecal Occult Bld: NEGATIVE

## 2022-10-13 ENCOUNTER — Ambulatory Visit: Payer: Self-pay | Attending: Nurse Practitioner | Admitting: Nurse Practitioner

## 2022-10-13 ENCOUNTER — Encounter: Payer: Self-pay | Admitting: Nurse Practitioner

## 2022-10-13 ENCOUNTER — Other Ambulatory Visit: Payer: Self-pay

## 2022-10-13 VITALS — BP 124/79 | HR 74 | Ht 65.0 in | Wt 171.6 lb

## 2022-10-13 DIAGNOSIS — E1169 Type 2 diabetes mellitus with other specified complication: Secondary | ICD-10-CM

## 2022-10-13 DIAGNOSIS — E785 Hyperlipidemia, unspecified: Secondary | ICD-10-CM

## 2022-10-13 DIAGNOSIS — Z23 Encounter for immunization: Secondary | ICD-10-CM

## 2022-10-13 DIAGNOSIS — J3089 Other allergic rhinitis: Secondary | ICD-10-CM

## 2022-10-13 MED ORDER — LORATADINE 10 MG PO TABS
10.0000 mg | ORAL_TABLET | Freq: Every day | ORAL | 11 refills | Status: DC
  Filled 2022-10-13: qty 30, 30d supply, fill #0

## 2022-10-13 MED ORDER — LORATADINE 10 MG PO TABS
10.0000 mg | ORAL_TABLET | Freq: Every day | ORAL | 3 refills | Status: DC
  Filled 2022-10-13: qty 90, 90d supply, fill #0
  Filled 2023-01-12 – 2023-01-13 (×2): qty 90, 90d supply, fill #1
  Filled 2023-04-22: qty 90, 90d supply, fill #2
  Filled 2023-06-02: qty 90, 90d supply, fill #3

## 2022-10-13 MED ORDER — FLUTICASONE PROPIONATE 50 MCG/ACT NA SUSP
2.0000 | Freq: Every day | NASAL | 6 refills | Status: DC
  Filled 2022-10-13: qty 16, 30d supply, fill #0
  Filled 2022-12-07: qty 16, 30d supply, fill #1
  Filled 2023-02-16: qty 16, 30d supply, fill #2
  Filled 2023-03-19: qty 16, 30d supply, fill #3
  Filled 2023-04-22: qty 16, 30d supply, fill #4
  Filled 2023-06-03 (×2): qty 16, 30d supply, fill #5

## 2022-10-13 MED ORDER — ZOSTER VAC RECOMB ADJUVANTED 50 MCG/0.5ML IM SUSR
0.5000 mL | Freq: Once | INTRAMUSCULAR | 0 refills | Status: AC
  Filled 2022-10-13: qty 0.5, 1d supply, fill #0

## 2022-10-13 NOTE — Progress Notes (Signed)
Needs allergy medication. Otc nasal spray

## 2022-10-13 NOTE — Progress Notes (Signed)
Assessment & Plan:  Isabella Joyce was seen today for depression and anxiety.  Diagnoses and all orders for this visit:  Environmental and seasonal allergies -     fluticasone (FLONASE) 50 MCG/ACT nasal spray; Place 2 sprays into both nostrils daily. -     Discontinue: loratadine (CLARITIN) 10 MG tablet; Take 1 tablet (10 mg total) by mouth daily. -     loratadine (CLARITIN) 10 MG tablet; Take 1 tablet (10 mg total) by mouth daily. For allergies  Need for shingles vaccine -     Zoster Vaccine Adjuvanted [facility]) injection; Inject 0.5 mLs into the muscle once for 1 dose.  Hyperlipidemia associated with type 2 diabetes mellitus (HCC) INSTRUCTIONS: Work on a low fat, heart healthy diet and participate in regular aerobic exercise program by working out at least 150 minutes per week; 5 days a week-30 minutes per day. Avoid red meat/beef/steak,  fried foods. junk foods, sodas, sugary drinks, unhealthy snacking, alcohol and smoking.  Drink at least 80 oz of water per day and monitor your carbohydrate intake daily.     Patient has been counseled on age-appropriate routine health concerns for screening and prevention. These are reviewed and up-to-date. Referrals have been placed accordingly. Immunizations are up-to-date or declined.    Subjective:   Chief Complaint  Patient presents with   Depression   Anxiety   HPI Isabella Joyce 52 y.o. female presents to office today accompanied by a Spanish interpreter. [name] is very HOH. She was initially scheduled for follow up to anxiety and Depression. However today she states she is no longer taking SSRI and feels much better. Does not want to take any medications for mood disorder at this time      10/13/2022    4:37 PM 08/30/2022    3:34 PM 05/11/2022    2:57 PM  Depression screen PHQ 2/9  Decreased Interest 1 2 0  Down, Depressed, Hopeless 1 2 0  PHQ - 2 Score 2 4 0  Altered sleeping 0 1 0  Tired, decreased energy 0 0 0  Change in  appetite 0 1 0  Feeling bad or failure about yourself  0 1 0  Trouble concentrating 0 1 0  Moving slowly or fidgety/restless 0 2 0  Suicidal thoughts 0 0 0  PHQ-9 Score 2 10 0       10/13/2022    4:37 PM 08/30/2022    3:34 PM 05/11/2022    2:57 PM 04/28/2022   11:36 AM  GAD 7 : Generalized Anxiety Score  Nervous, Anxious, on Edge 0 3 1 1   Control/stop worrying 0 2 0 0  Worry too much - different things 1 2 0 0  Trouble relaxing 0 0 0 0  Restless 0 2 0 0  Easily annoyed or irritable 0 2 0 0  Afraid - awful might happen 0 3 0 0  Total GAD 7 Score 1 14 1 1      Seasonal Allergies Chronic with associated symptoms of allergic rhinitis, watery eyes and nasal congestion.     Review of Systems  Constitutional:  Negative for fever, malaise/fatigue and weight loss.  HENT:  Positive for congestion and hearing loss. Negative for nosebleeds.   Eyes: Negative.  Negative for blurred vision, double vision and photophobia.  Respiratory: Negative.  Negative for cough and shortness of breath.   Cardiovascular: Negative.  Negative for chest pain, palpitations and leg swelling.  Gastrointestinal: Negative.  Negative for heartburn, nausea and vomiting.  Musculoskeletal: Negative.  Negative for myalgias.  Neurological: Negative.  Negative for dizziness, focal weakness, seizures and headaches.  Endo/Heme/Allergies:  Positive for environmental allergies.  Psychiatric/Behavioral: Negative.  Negative for suicidal ideas.     Past Medical History:  Diagnosis Date   COVID-19 virus infection 12/12/2018   Diabetes mellitus without complication (HCC) 2009   Hearing deficit    Hyperlipidemia    Hypertension 2008    History reviewed. No pertinent surgical history.  Family History  Problem Relation Age of Onset   Diabetes Mother    Hypertension Mother    Diabetes Father    Hypertension Father    Cancer Neg Hx    Heart disease Neg Hx     Social History Reviewed with no changes to be made today.    Outpatient Medications Prior to Visit  Medication Sig Dispense Refill   atorvastatin (LIPITOR) 40 MG tablet Take 1 tablet (40 mg total) by mouth daily at 6 PM for cholesterol 90 tablet 3   Blood Glucose Monitoring Suppl (TRUE METRIX METER) w/Device KIT USE AS DIRECTED 2 TIMES DAILY 1 kit 0   diltiazem (CARDIZEM CD) 180 MG 24 hr capsule Take 1 capsule (180 mg total) by mouth daily for blood pressure 90 capsule 1   glimepiride (AMARYL) 4 MG tablet Take 1 tablet (4 mg total) by mouth daily before breakfast. 90 tablet 1   glucose blood (TRUE METRIX BLOOD GLUCOSE TEST) test strip Use to check blood sugar once daily. (Must have office visit for refills) 100 each 6   hydrochlorothiazide (MICROZIDE) 12.5 MG capsule Take 1 capsule (12.5 mg total) by mouth daily. 90 capsule 3   lisinopril (ZESTRIL) 40 MG tablet Take 1 tablet (40 mg total) by mouth daily. 90 tablet 1   metFORMIN (GLUCOPHAGE-XR) 500 MG 24 hr tablet Take 2 tablets (1,000 mg total) by mouth 2 (two) times daily with a meal. 360 tablet 1   TRUEplus Lancets 28G MISC USE AS DIRECTED ONCE DAILY 100 each 6   PARoxetine (PAXIL) 10 MG tablet Take 1 tablet (10 mg total) by mouth daily for depression and hot flashes (Patient not taking: Reported on 10/13/2022) 30 tablet 3   No facility-administered medications prior to visit.    Allergies  Allergen Reactions   Pollen Extract     Hear swelling       Objective:    BP 124/79 (BP Location: Left Arm, Patient Position: Sitting, Cuff Size: Normal)   Pulse 74   Ht 5\' 5"  (1.651 m)   Wt 171 lb 9.6 oz (77.8 kg)   LMP  (LMP Unknown)   SpO2 98%   BMI 28.56 kg/m  Wt Readings from Last 3 Encounters:  10/13/22 171 lb 9.6 oz (77.8 kg)  08/30/22 167 lb 9.6 oz (76 kg)  05/11/22 178 lb (80.7 kg)    Physical Exam Vitals and nursing note reviewed.  Constitutional:      Appearance: She is well-developed.  HENT:     Head: Normocephalic and atraumatic.     Right Ear: Decreased hearing noted.     Left  Ear: Decreased hearing noted.  Cardiovascular:     Rate and Rhythm: Normal rate and regular rhythm.     Heart sounds: Normal heart sounds. No murmur heard.    No friction rub. No gallop.  Pulmonary:     Effort: Pulmonary effort is normal. No tachypnea or respiratory distress.     Breath sounds: Normal breath sounds. No decreased breath sounds, wheezing, rhonchi or  rales.  Chest:     Chest wall: No tenderness.  Abdominal:     General: Bowel sounds are normal.     Palpations: Abdomen is soft.  Musculoskeletal:        General: Normal range of motion.     Cervical back: Normal range of motion.  Skin:    General: Skin is warm and dry.  Neurological:     Mental Status: She is alert and oriented to person, place, and time.     Coordination: Coordination normal.  Psychiatric:        Behavior: Behavior normal. Behavior is cooperative.        Thought Content: Thought content normal.        Judgment: Judgment normal.          Patient has been counseled extensively about nutrition and exercise as well as the importance of adherence with medications and regular follow-up. The patient was given clear instructions to go to ER or return to medical center if symptoms don't improve, worsen or new problems develop. The patient verbalized understanding.   Follow-up: Return in about 9 weeks (around 12/15/2022).   Claiborne Rigg, FNP-BC [facility] and [facility] [place], [place] [phone]   10/15/2022, 8:58 AM

## 2022-10-14 ENCOUNTER — Other Ambulatory Visit: Payer: Self-pay

## 2022-10-15 ENCOUNTER — Encounter: Payer: Self-pay | Admitting: Nurse Practitioner

## 2022-10-26 ENCOUNTER — Other Ambulatory Visit: Payer: Self-pay

## 2022-10-27 ENCOUNTER — Other Ambulatory Visit: Payer: Self-pay

## 2022-10-29 ENCOUNTER — Other Ambulatory Visit: Payer: Self-pay

## 2022-11-05 ENCOUNTER — Other Ambulatory Visit: Payer: Self-pay

## 2022-11-17 ENCOUNTER — Other Ambulatory Visit: Payer: Self-pay

## 2022-11-23 ENCOUNTER — Other Ambulatory Visit: Payer: Self-pay

## 2022-12-07 ENCOUNTER — Other Ambulatory Visit: Payer: Self-pay

## 2022-12-15 ENCOUNTER — Encounter: Payer: Self-pay | Admitting: Nurse Practitioner

## 2022-12-15 ENCOUNTER — Other Ambulatory Visit (HOSPITAL_COMMUNITY)
Admission: RE | Admit: 2022-12-15 | Discharge: 2022-12-15 | Disposition: A | Discharge status: Home / Self Care | Payer: Self-pay | Source: Ambulatory Visit | Attending: Nurse Practitioner | Admitting: Nurse Practitioner

## 2022-12-15 ENCOUNTER — Ambulatory Visit: Payer: Self-pay | Attending: Nurse Practitioner | Admitting: Nurse Practitioner

## 2022-12-15 ENCOUNTER — Other Ambulatory Visit: Payer: Self-pay

## 2022-12-15 VITALS — BP 126/81 | HR 74 | Ht 65.0 in | Wt 176.0 lb

## 2022-12-15 DIAGNOSIS — I10 Essential (primary) hypertension: Secondary | ICD-10-CM

## 2022-12-15 DIAGNOSIS — Z7984 Long term (current) use of oral hypoglycemic drugs: Secondary | ICD-10-CM

## 2022-12-15 DIAGNOSIS — N760 Acute vaginitis: Secondary | ICD-10-CM | POA: Insufficient documentation

## 2022-12-15 DIAGNOSIS — E1165 Type 2 diabetes mellitus with hyperglycemia: Secondary | ICD-10-CM

## 2022-12-15 LAB — POCT GLYCOSYLATED HEMOGLOBIN (HGB A1C): HbA1c, POC (controlled diabetic range): 7.2 % — AB (ref 0.0–7.0)

## 2022-12-15 LAB — GLUCOSE, POCT (MANUAL RESULT ENTRY): POC Glucose: 170 mg/dl — AB (ref 70–99)

## 2022-12-15 MED ORDER — METFORMIN HCL ER 500 MG PO TB24
1000.0000 mg | ORAL_TABLET | Freq: Two times a day (BID) | ORAL | 1 refills | Status: DC
Start: 2022-12-15 — End: 2023-06-21
  Filled 2022-12-15: qty 360, 90d supply, fill #0
  Filled 2023-04-04: qty 120, 30d supply, fill #0
  Filled 2023-04-04: qty 360, 90d supply, fill #0
  Filled 2023-05-25: qty 120, 30d supply, fill #1

## 2022-12-15 MED ORDER — LISINOPRIL 40 MG PO TABS
40.0000 mg | ORAL_TABLET | Freq: Every day | ORAL | 1 refills | Status: AC
Start: 2022-12-15 — End: ?
  Filled 2022-12-15 – 2023-03-19 (×2): qty 90, 90d supply, fill #0
  Filled 2023-06-02 – 2023-06-21 (×3): qty 90, 90d supply, fill #1

## 2022-12-15 MED ORDER — GLIMEPIRIDE 4 MG PO TABS
4.0000 mg | ORAL_TABLET | Freq: Every day | ORAL | 1 refills | Status: DC
  Filled 2022-12-15 – 2023-03-19 (×2): qty 90, 90d supply, fill #0
  Filled 2023-06-02 – 2023-06-21 (×3): qty 90, 90d supply, fill #1

## 2022-12-15 MED ORDER — DILTIAZEM HCL ER COATED BEADS 180 MG PO CP24
180.0000 mg | ORAL_CAPSULE | Freq: Every day | ORAL | 1 refills | Status: DC
  Filled 2022-12-15: qty 90, 90d supply, fill #0
  Filled 2023-03-07: qty 30, 30d supply, fill #0
  Filled 2023-04-04 (×2): qty 30, 30d supply, fill #1
  Filled 2023-05-02 (×2): qty 30, 30d supply, fill #2
  Filled 2023-06-02: qty 30, 30d supply, fill #3

## 2022-12-15 NOTE — Progress Notes (Signed)
Assessment & Plan:  Isabella Joyce was seen today for diabetes.  Diagnoses and all orders for this visit:  Type 2 diabetes mellitus with hyperglycemia, without long-term current use of insulin (HCC) -     POCT glucose (manual entry) -     POCT glycosylated hemoglobin (Hb A1C) -     glimepiride (AMARYL) 4 MG tablet; Take 1 tablet (4 mg total) by mouth daily before breakfast. -     metFORMIN (GLUCOPHAGE-XR) 500 MG 24 hr tablet; Take 2 tablets (1,000 mg total) by mouth 2 (two) times daily with a meal. Continue blood sugar control as discussed in office today, low carbohydrate diet, and regular physical exercise as tolerated, 150 minutes per week (30 min each day, 5 days per week, or 50 min 3 days per week). Keep blood sugar logs with fasting goal of 90-130 mg/dl, post prandial (after you eat) less than 180.  For Hypoglycemia: BS <60 and Hyperglycemia BS >400; contact the clinic ASAP. Annual eye exams and foot exams are recommended.   Primary hypertension -     diltiazem (CARDIZEM CD) 180 MG 24 hr capsule; Take 1 capsule (180 mg total) by mouth daily for blood pressure -     lisinopril (ZESTRIL) 40 MG tablet; Take 1 tablet (40 mg total) by mouth daily. Continue all antihypertensives as prescribed.  Reminded to bring in blood pressure log for follow  up appointment.  RECOMMENDATIONS: DASH/Mediterranean Diets are healthier choices for HTN.    Acute vaginitis -     Cervicovaginal ancillary only    Patient has been counseled on age-appropriate routine health concerns for screening and prevention. These are reviewed and up-to-date. Referrals have been placed accordingly. Immunizations are up-to-date or declined.    Subjective:   Chief Complaint  Patient presents with   Diabetes   HPI Isabella Joyce 52 y.o. female presents to office today for diabetes.   She has a past medical history of COVID-19 virus infection (12/12/2018), Diabetes mellitus without complication (HCC) (2009), Hearing  deficit, Hyperlipidemia, and Hypertension (2008)    VRI was used to communicate directly with patient for the entire encounter including providing detailed patient instructions   Patient has been counseled on age-appropriate routine health concerns for screening and prevention. These are reviewed and up-to-date. Referrals have been placed accordingly. Immunizations are up-to-date or declined.     MAMMOGRAM: overdue. Numerous referrals have been placed.  COLON CANCER SCREEN: UTD PAP SMEAR: OVERDUE. Schedule for next visit.    DM 2 She is monitoring her blood glucose levels at home but not every day. 100-140. Diabetes is . Taking metformin XR 1000 mg BID and amaryl 4 mg daily. Cholesterol levels well controlled with atorvastatin 40 mg daily.  Lab Results  Component Value Date   HGBA1C 7.2 (A) 12/15/2022    Lab Results  Component Value Date   HGBA1C 7.5 (H) 08/30/2022  LDL at goal.  Lab Results  Component Value Date   LDLCALC 50 04/28/2022      HTN Well controlled. Taking diltiazem 180 mg daily,  HCTZ 12.5 mg daily and lisinopril 40 mg daily as prescribed.  BP Readings from Last 3 Encounters:  12/15/22 126/81  10/13/22 124/79  08/30/22 (!) 157/93     She would like to perform a self swab today. States husband has foul odor coming from penis and she wants to make sure she doesn't "have anything". She denies any GU symptoms.   Review of Systems  Constitutional:  Negative for fever, malaise/fatigue  and weight loss.  HENT:  Positive for hearing loss. Negative for nosebleeds.   Eyes: Negative.  Negative for blurred vision, double vision and photophobia.  Respiratory: Negative.  Negative for cough and shortness of breath.   Cardiovascular: Negative.  Negative for chest pain, palpitations and leg swelling.  Gastrointestinal: Negative.  Negative for heartburn, nausea and vomiting.  Musculoskeletal: Negative.  Negative for myalgias.  Neurological: Negative.  Negative for dizziness,  focal weakness, seizures and headaches.  Psychiatric/Behavioral: Negative.  Negative for suicidal ideas.     Past Medical History:  Diagnosis Date   COVID-19 virus infection 12/12/2018   Diabetes mellitus without complication (HCC) 2009   Hearing deficit    Hyperlipidemia    Hypertension 2008    No past surgical history on file.  Family History  Problem Relation Age of Onset   Diabetes Mother    Hypertension Mother    Diabetes Father    Hypertension Father    Cancer Neg Hx    Heart disease Neg Hx     Social History Reviewed with no changes to be made today.   Outpatient Medications Prior to Visit  Medication Sig Dispense Refill   atorvastatin (LIPITOR) 40 MG tablet Take 1 tablet (40 mg total) by mouth daily at 6 PM for cholesterol 90 tablet 3   Blood Glucose Monitoring Suppl (TRUE METRIX METER) w/Device KIT USE AS DIRECTED 2 TIMES DAILY 1 kit 0   fluticasone (FLONASE) 50 MCG/ACT nasal spray Place 2 sprays into both nostrils daily. 16 g 6   glucose blood (TRUE METRIX BLOOD GLUCOSE TEST) test strip Use to check blood sugar once daily. (Must have office visit for refills) 100 each 6   hydrochlorothiazide (MICROZIDE) 12.5 MG capsule Take 1 capsule (12.5 mg total) by mouth daily. 90 capsule 3   loratadine (CLARITIN) 10 MG tablet Take 1 tablet (10 mg total) by mouth daily. For allergies 90 tablet 3   TRUEplus Lancets 28G MISC USE AS DIRECTED ONCE DAILY 100 each 6   diltiazem (CARDIZEM CD) 180 MG 24 hr capsule Take 1 capsule (180 mg total) by mouth daily for blood pressure 90 capsule 1   glimepiride (AMARYL) 4 MG tablet Take 1 tablet (4 mg total) by mouth daily before breakfast. 90 tablet 1   lisinopril (ZESTRIL) 40 MG tablet Take 1 tablet (40 mg total) by mouth daily. 90 tablet 1   metFORMIN (GLUCOPHAGE-XR) 500 MG 24 hr tablet Take 2 tablets (1,000 mg total) by mouth 2 (two) times daily with a meal. 360 tablet 1   No facility-administered medications prior to visit.    Allergies   Allergen Reactions   Pollen Extract     Hear swelling       Objective:    BP 126/81 (BP Location: Left Arm, Patient Position: Sitting, Cuff Size: Normal)   Pulse 74   Wt 176 lb (79.8 kg)   SpO2 97%   BMI 29.29 kg/m  Wt Readings from Last 3 Encounters:  12/15/22 176 lb (79.8 kg)  10/13/22 171 lb 9.6 oz (77.8 kg)  08/30/22 167 lb 9.6 oz (76 kg)    Physical Exam Vitals and nursing note reviewed.  Constitutional:      Appearance: She is well-developed.  HENT:     Head: Normocephalic and atraumatic.     Right Ear: Decreased hearing noted.     Left Ear: Decreased hearing noted.  Cardiovascular:     Rate and Rhythm: Normal rate and regular rhythm.  Heart sounds: Normal heart sounds. No murmur heard.    No friction rub. No gallop.  Pulmonary:     Effort: Pulmonary effort is normal. No tachypnea or respiratory distress.     Breath sounds: Normal breath sounds. No decreased breath sounds, wheezing, rhonchi or rales.  Chest:     Chest wall: No tenderness.  Abdominal:     General: Bowel sounds are normal.     Palpations: Abdomen is soft.  Musculoskeletal:        General: Normal range of motion.     Cervical back: Normal range of motion.  Skin:    General: Skin is warm and dry.  Neurological:     Mental Status: She is alert and oriented to person, place, and time.     Coordination: Coordination normal.  Psychiatric:        Behavior: Behavior normal. Behavior is cooperative.        Thought Content: Thought content normal.        Judgment: Judgment normal.          Patient has been counseled extensively about nutrition and exercise as well as the importance of adherence with medications and regular follow-up. The patient was given clear instructions to go to ER or return to medical center if symptoms don't improve, worsen or new problems develop. The patient verbalized understanding.   Follow-up: Return for PAP SMEAR OCTOBER 4 or later.   Claiborne Rigg,  FNP-BC [facility] and [facility] [place], [place] [phone]   12/15/2022, 3:58 PM

## 2022-12-16 LAB — CMP14+EGFR
ALT: 20 IU/L (ref 0–32)
AST: 17 IU/L (ref 0–40)
Albumin: 4 g/dL (ref 3.8–4.9)
Alkaline Phosphatase: 107 IU/L (ref 44–121)
BUN/Creatinine Ratio: 21 (ref 9–23)
BUN: 17 mg/dL (ref 6–24)
Bilirubin Total: 0.4 mg/dL (ref 0.0–1.2)
CO2: 24 mmol/L (ref 20–29)
Calcium: 8.9 mg/dL (ref 8.7–10.2)
Chloride: 104 mmol/L (ref 96–106)
Creatinine, Ser: 0.82 mg/dL (ref 0.57–1.00)
Globulin, Total: 3.1 g/dL (ref 1.5–4.5)
Glucose: 127 mg/dL — ABNORMAL HIGH (ref 70–99)
Potassium: 4 mmol/L (ref 3.5–5.2)
Sodium: 140 mmol/L (ref 134–144)
Total Protein: 7.1 g/dL (ref 6.0–8.5)
eGFR: 87 mL/min/{1.73_m2} (ref >59)

## 2022-12-20 LAB — CERVICOVAGINAL ANCILLARY ONLY
Bacterial Vaginitis (gardnerella): NEGATIVE
Candida Glabrata: NEGATIVE
Candida Vaginitis: NEGATIVE
Chlamydia: NEGATIVE
Comment: NEGATIVE
Comment: NEGATIVE
Comment: NEGATIVE
Comment: NEGATIVE
Comment: NEGATIVE
Comment: NORMAL
Neisseria Gonorrhea: NEGATIVE
Trichomonas: NEGATIVE

## 2022-12-27 ENCOUNTER — Telehealth: Payer: Self-pay | Admitting: Nurse Practitioner

## 2022-12-27 NOTE — Telephone Encounter (Signed)
Patient is calling request if lab results can be sent via mail.

## 2022-12-29 NOTE — Telephone Encounter (Signed)
Lab results have been mailed to patient.

## 2023-01-13 ENCOUNTER — Other Ambulatory Visit: Payer: Self-pay

## 2023-01-14 ENCOUNTER — Other Ambulatory Visit: Payer: Self-pay

## 2023-02-16 ENCOUNTER — Other Ambulatory Visit: Payer: Self-pay

## 2023-02-17 ENCOUNTER — Other Ambulatory Visit: Payer: Self-pay

## 2023-03-07 ENCOUNTER — Other Ambulatory Visit: Payer: Self-pay

## 2023-03-08 ENCOUNTER — Other Ambulatory Visit: Payer: Self-pay

## 2023-03-21 ENCOUNTER — Ambulatory Visit: Payer: Self-pay | Attending: Nurse Practitioner | Admitting: Nurse Practitioner

## 2023-03-21 ENCOUNTER — Other Ambulatory Visit: Payer: Self-pay

## 2023-03-21 ENCOUNTER — Other Ambulatory Visit (HOSPITAL_COMMUNITY)
Admission: RE | Admit: 2023-03-21 | Discharge: 2023-03-21 | Disposition: A | Discharge status: Home / Self Care | Payer: Self-pay | Source: Ambulatory Visit | Attending: Nurse Practitioner | Admitting: Nurse Practitioner

## 2023-03-21 ENCOUNTER — Encounter: Payer: Self-pay | Admitting: Nurse Practitioner

## 2023-03-21 VITALS — BP 142/98 | HR 78 | Wt 187.0 lb

## 2023-03-21 DIAGNOSIS — Z23 Encounter for immunization: Secondary | ICD-10-CM

## 2023-03-21 DIAGNOSIS — I10 Essential (primary) hypertension: Secondary | ICD-10-CM

## 2023-03-21 DIAGNOSIS — Z124 Encounter for screening for malignant neoplasm of cervix: Secondary | ICD-10-CM

## 2023-03-21 DIAGNOSIS — E1165 Type 2 diabetes mellitus with hyperglycemia: Secondary | ICD-10-CM

## 2023-03-21 DIAGNOSIS — Z7984 Long term (current) use of oral hypoglycemic drugs: Secondary | ICD-10-CM

## 2023-03-21 DIAGNOSIS — Z1231 Encounter for screening mammogram for malignant neoplasm of breast: Secondary | ICD-10-CM

## 2023-03-21 NOTE — Addendum Note (Signed)
Addended by: Dalene Carrow I on: 03/21/2023 04:51 PM   Modules accepted: Orders

## 2023-03-21 NOTE — Patient Instructions (Signed)
[facility] has been trying to call you to schedule a mammogram. You can call them at any of these numbers to schedule.  [phone]

## 2023-03-21 NOTE — Progress Notes (Signed)
Assessment & Plan:  Isabella Joyce was seen today for gynecologic exam.  Diagnoses and all orders for this visit:  Encounter for Papanicolaou smear for cervical cancer screening -     Cytology - PAP -     Cervicovaginal ancillary only  Type 2 diabetes mellitus with hyperglycemia, without long-term current use of insulin (HCC) -     Hemoglobin A1c  Breast cancer screening by mammogram -     MS 3D SCR MAMMO BILAT BR (aka MM); Future  Primary hypertension    Patient has been counseled on age-appropriate routine health concerns for screening and prevention. These are reviewed and up-to-date. Referrals have been placed accordingly. Immunizations are up-to-date or declined.    Subjective:   Chief Complaint  Patient presents with   Gynecologic Exam   HPI Isabella Joyce 52 y.o. female presents to office today for pap smear.    Patient has been counseled on age-appropriate routine health concerns for screening and prevention. These are reviewed and up-to-date. Referrals have been placed accordingly. Immunizations are up-to-date or declined.  MAMMOGRAM: OVERDUE. Referral placed COLON CANCER SCREENING :   UTD PAP SMEAR: UTD      Review of Systems  Constitutional: Negative.  Negative for chills, fever, malaise/fatigue and weight loss.  Respiratory: Negative.  Negative for cough, shortness of breath and wheezing.   Cardiovascular: Negative.  Negative for chest pain, orthopnea and leg swelling.  Gastrointestinal:  Negative for abdominal pain.  Genitourinary: Negative.  Negative for flank pain.  Skin: Negative.  Negative for rash.  Psychiatric/Behavioral:  Negative for suicidal ideas.     Past Medical History:  Diagnosis Date   COVID-19 virus infection 12/12/2018   Diabetes mellitus without complication (HCC) 2009   Hearing deficit    Hyperlipidemia    Hypertension 2008    History reviewed. No pertinent surgical history.  Family History  Problem Relation Age of Onset    Diabetes Mother    Hypertension Mother    Diabetes Father    Hypertension Father    Cancer Neg Hx    Heart disease Neg Hx     Social History Reviewed with no changes to be made today.   Outpatient Medications Prior to Visit  Medication Sig Dispense Refill   atorvastatin (LIPITOR) 40 MG tablet Take 1 tablet (40 mg total) by mouth daily at 6 PM for cholesterol 90 tablet 3   Blood Glucose Monitoring Suppl (TRUE METRIX METER) w/Device KIT USE AS DIRECTED 2 TIMES DAILY 1 kit 0   diltiazem (CARDIZEM CD) 180 MG 24 hr capsule Take 1 capsule (180 mg total) by mouth daily for blood pressure 90 capsule 1   fluticasone (FLONASE) 50 MCG/ACT nasal spray Place 2 sprays into both nostrils daily. 16 g 6   glimepiride (AMARYL) 4 MG tablet Take 1 tablet (4 mg total) by mouth daily before breakfast. 90 tablet 1   glucose blood (TRUE METRIX BLOOD GLUCOSE TEST) test strip Use to check blood sugar once daily. (Must have office visit for refills) 100 each 6   hydrochlorothiazide (MICROZIDE) 12.5 MG capsule Take 1 capsule (12.5 mg total) by mouth daily. 90 capsule 3   lisinopril (ZESTRIL) 40 MG tablet Take 1 tablet (40 mg total) by mouth daily. 90 tablet 1   loratadine (CLARITIN) 10 MG tablet Take 1 tablet (10 mg total) by mouth daily. For allergies 90 tablet 3   metFORMIN (GLUCOPHAGE-XR) 500 MG 24 hr tablet Take 2 tablets (1,000 mg total) by mouth 2 (two) times  daily with a meal. 360 tablet 1   TRUEplus Lancets 28G MISC USE AS DIRECTED ONCE DAILY 100 each 6   No facility-administered medications prior to visit.    Allergies  Allergen Reactions   Pollen Extract     Hear swelling       Objective:    BP (!) 142/98 (BP Location: Left Arm, Patient Position: Sitting, Cuff Size: Normal)   Pulse 78   Wt 187 lb (84.8 kg)   SpO2 98%   BMI 31.12 kg/m  Wt Readings from Last 3 Encounters:  03/21/23 187 lb (84.8 kg)  12/15/22 176 lb (79.8 kg)  10/13/22 171 lb 9.6 oz (77.8 kg)    Physical Exam Exam  conducted with a chaperone present.  Constitutional:      Appearance: She is well-developed.  HENT:     Head: Normocephalic.  Cardiovascular:     Rate and Rhythm: Normal rate and regular rhythm.     Heart sounds: Normal heart sounds.  Pulmonary:     Effort: Pulmonary effort is normal.     Breath sounds: Normal breath sounds.  Abdominal:     General: Bowel sounds are normal.     Palpations: Abdomen is soft.     Hernia: There is no hernia in the left inguinal area.  Genitourinary:    Exam position: Lithotomy position.     Labia:        Right: No rash, tenderness, lesion or injury.        Left: No rash, tenderness, lesion or injury.      Vagina: Normal. No signs of injury and foreign body. No vaginal discharge, erythema, tenderness or bleeding.     Cervix: Normal.     Uterus: Not deviated and not enlarged.      Adnexa:        Right: No mass, tenderness or fullness.         Left: No mass, tenderness or fullness.       Rectum: Normal. No external hemorrhoid.  Lymphadenopathy:     Lower Body: No right inguinal adenopathy. No left inguinal adenopathy.  Skin:    General: Skin is warm and dry.  Neurological:     Mental Status: She is alert and oriented to person, place, and time.  Psychiatric:        Behavior: Behavior normal.        Thought Content: Thought content normal.        Judgment: Judgment normal.          Patient has been counseled extensively about nutrition and exercise as well as the importance of adherence with medications and regular follow-up. The patient was given clear instructions to go to ER or return to medical center if symptoms don't improve, worsen or new problems develop. The patient verbalized understanding.   Follow-up: Return in about 3 months (around 06/21/2023).   Claiborne Rigg, FNP-BC [facility] [place], [place] [phone]   03/21/2023, 4:33 PM

## 2023-03-22 LAB — HEMOGLOBIN A1C
Est. average glucose Bld gHb Est-mCnc: 169 mg/dL
Hgb A1c MFr Bld: 7.5 % — ABNORMAL HIGH (ref 4.8–5.6)

## 2023-03-23 LAB — CERVICOVAGINAL ANCILLARY ONLY
Bacterial Vaginitis (gardnerella): NEGATIVE
Candida Glabrata: NEGATIVE
Candida Vaginitis: NEGATIVE
Chlamydia: NEGATIVE
Comment: NEGATIVE
Comment: NEGATIVE
Comment: NEGATIVE
Comment: NEGATIVE
Comment: NEGATIVE
Comment: NORMAL
Neisseria Gonorrhea: NEGATIVE
Trichomonas: NEGATIVE

## 2023-03-24 LAB — CYTOLOGY - PAP
Comment: NEGATIVE
Diagnosis: NEGATIVE
High risk HPV: NEGATIVE

## 2023-04-04 ENCOUNTER — Other Ambulatory Visit: Payer: Self-pay

## 2023-04-07 ENCOUNTER — Other Ambulatory Visit: Payer: Self-pay

## 2023-04-19 ENCOUNTER — Telehealth: Payer: Self-pay

## 2023-04-19 NOTE — Telephone Encounter (Signed)
Telephoned patient at mobile number using interpreter, Isabella Joyce. Voice mail not an option at this time. [facility] (scholarship)

## 2023-04-22 ENCOUNTER — Other Ambulatory Visit: Payer: Self-pay

## 2023-04-27 ENCOUNTER — Other Ambulatory Visit: Payer: Self-pay

## 2023-05-02 ENCOUNTER — Other Ambulatory Visit: Payer: Self-pay

## 2023-05-06 ENCOUNTER — Other Ambulatory Visit: Payer: Self-pay

## 2023-05-25 ENCOUNTER — Other Ambulatory Visit: Payer: Self-pay

## 2023-05-26 ENCOUNTER — Other Ambulatory Visit: Payer: Self-pay

## 2023-06-02 ENCOUNTER — Other Ambulatory Visit: Payer: Self-pay

## 2023-06-03 ENCOUNTER — Other Ambulatory Visit: Payer: Self-pay

## 2023-06-10 ENCOUNTER — Other Ambulatory Visit: Payer: Self-pay

## 2023-06-21 ENCOUNTER — Encounter: Payer: Self-pay | Admitting: Nurse Practitioner

## 2023-06-21 ENCOUNTER — Ambulatory Visit: Payer: Self-pay | Attending: Nurse Practitioner | Admitting: Nurse Practitioner

## 2023-06-21 ENCOUNTER — Other Ambulatory Visit: Payer: Self-pay

## 2023-06-21 VITALS — BP 119/77 | HR 77 | Ht 65.0 in | Wt 187.8 lb

## 2023-06-21 DIAGNOSIS — E785 Hyperlipidemia, unspecified: Secondary | ICD-10-CM

## 2023-06-21 DIAGNOSIS — I10 Essential (primary) hypertension: Secondary | ICD-10-CM

## 2023-06-21 DIAGNOSIS — Z7984 Long term (current) use of oral hypoglycemic drugs: Secondary | ICD-10-CM

## 2023-06-21 DIAGNOSIS — J3089 Other allergic rhinitis: Secondary | ICD-10-CM

## 2023-06-21 DIAGNOSIS — E1165 Type 2 diabetes mellitus with hyperglycemia: Secondary | ICD-10-CM

## 2023-06-21 DIAGNOSIS — E1169 Type 2 diabetes mellitus with other specified complication: Secondary | ICD-10-CM

## 2023-06-21 LAB — POCT GLYCOSYLATED HEMOGLOBIN (HGB A1C): Hemoglobin A1C: 7.1 % — AB (ref 4.0–5.6)

## 2023-06-21 MED ORDER — ATORVASTATIN CALCIUM 40 MG PO TABS
40.0000 mg | ORAL_TABLET | Freq: Every day | ORAL | 3 refills | Status: DC
Start: 2023-06-21 — End: 2023-09-20
  Filled 2023-06-21 – 2023-07-18 (×2): qty 90, 90d supply, fill #0

## 2023-06-21 MED ORDER — LISINOPRIL 40 MG PO TABS: 40.0000 mg | ORAL_TABLET | Freq: Every day | ORAL | 1 refills | Status: DC

## 2023-06-21 MED ORDER — METFORMIN HCL ER 500 MG PO TB24
1000.0000 mg | ORAL_TABLET | Freq: Two times a day (BID) | ORAL | 1 refills | Status: DC
  Filled 2023-06-21: qty 360, 90d supply, fill #0

## 2023-06-21 MED ORDER — DILTIAZEM HCL ER COATED BEADS 180 MG PO CP24
180.0000 mg | ORAL_CAPSULE | Freq: Every day | ORAL | 1 refills | Status: DC
  Filled 2023-06-21 – 2023-07-04 (×2): qty 90, 90d supply, fill #0

## 2023-06-21 MED ORDER — HYDROCHLOROTHIAZIDE 12.5 MG PO CAPS: 12.5000 mg | ORAL_CAPSULE | Freq: Every day | ORAL | 3 refills | Status: DC

## 2023-06-21 MED ORDER — GLIMEPIRIDE 4 MG PO TABS: 4.0000 mg | ORAL_TABLET | Freq: Every day | ORAL | 1 refills | Status: DC

## 2023-06-21 MED ORDER — LORATADINE 10 MG PO TABS
10.0000 mg | ORAL_TABLET | Freq: Every day | ORAL | 3 refills | Status: DC
  Filled 2023-06-21: qty 90, 90d supply, fill #0

## 2023-06-21 NOTE — Progress Notes (Signed)
 Assessment & Plan:  Zeenat was seen today for medical management of chronic issues.  Diagnoses and all orders for this visit:  Type 2 diabetes mellitus with hyperglycemia, without long-term current use of insulin (HCC) -     POCT glycosylated hemoglobin (Hb A1C) -     glimepiride  (AMARYL ) 4 MG tablet; Take 1 tablet (4 mg total) by mouth daily before breakfast. For diabetes. Please fill as a 90 day supply -     metFORMIN  (GLUCOPHAGE -XR) 500 MG 24 hr tablet; Take 2 tablets (1,000 mg total) by mouth 2 (two) times daily with a meal. FOR DIABETES. Please fill as a 90 day supply -     CMP14+EGFR -     CBC with Differential Continue blood sugar control as discussed in office today, low carbohydrate diet, and regular physical exercise as tolerated, 150 minutes per week (30 min each day, 5 days per week, or 50 min 3 days per week). Keep blood sugar logs with fasting goal of 90-130 mg/dl, post prandial (after you eat) less than 180.  For Hypoglycemia: BS <60 and Hyperglycemia BS >400; contact the clinic ASAP. Annual eye exams and foot exams are recommended.   Hyperlipidemia associated with type 2 diabetes mellitus (HCC) -     atorvastatin  (LIPITOR) 40 MG tablet; Take 1 tablet (40 mg total) by mouth daily at 6 PM. Please fill as a 90 day supply. For cholesterol INSTRUCTIONS: Work on a low fat, heart healthy diet and participate in regular aerobic exercise program by working out at least 150 minutes per week; 5 days a week-30 minutes per day. Avoid red meat/beef/steak,  fried foods. junk foods, sodas, sugary drinks, unhealthy snacking, alcohol and smoking.  Drink at least 80 oz of water per day and monitor your carbohydrate intake daily.    Primary hypertension -     diltiazem  (CARDIZEM  CD) 180 MG 24 hr capsule; Take 1 capsule (180 mg total) by mouth daily. For blood pressure. Please fill as a 90 day supply -     hydrochlorothiazide  (MICROZIDE ) 12.5 MG capsule; Take 1 capsule (12.5 mg total) by mouth  daily. For hypertension. Please fill as a 90 day supply -     lisinopril  (ZESTRIL ) 40 MG tablet; Take 1 tablet (40 mg total) by mouth daily. For blood pressure. Please fill as a 90 day supply Continue all antihypertensives as prescribed.  Reminded to bring in blood pressure log for follow  up appointment.  RECOMMENDATIONS: DASH/Mediterranean Diets are healthier choices for HTN.    Environmental and seasonal allergies -     loratadine  (CLARITIN ) 10 MG tablet; Take 1 tablet (10 mg total) by mouth daily. For allergies    Patient has been counseled on age-appropriate routine health concerns for screening and prevention. These are reviewed and up-to-date. Referrals have been placed accordingly. Immunizations are up-to-date or declined.    Subjective:   Chief Complaint  Patient presents with   Medical Management of Chronic Issues    Isabella Joyce 53 y.o. female presents to office today for follow up to DM   He has a past medical history of COVID-19 virus infection (12/12/2018), DM 2 (2009), Hearing deficit, Hyperlipidemia, and Hypertension (2008).    DM 2 Diabetes has improved from 7.5 to 7.1. She is currently taking glimepiride  4 mg daily and metformin  1000 mg BID.  Lab Results  Component Value Date   HGBA1C 7.1 (A) 06/21/2023    Lab Results  Component Value Date   HGBA1C 7.5 (H)  03/21/2023   Cholesterol levels at goal of <70 with atorvastatin  40 mg daily.  Lab Results  Component Value Date   LDLCALC 50 04/28/2022    HTN Blood pressure at goal with lisinopril  40 mg daily.  BP Readings from Last 3 Encounters:  06/21/23 119/77  03/21/23 (!) 142/98  12/15/22 126/81    Review of Systems  Constitutional:  Negative for fever, malaise/fatigue and weight loss.  HENT: Negative.  Negative for nosebleeds.   Eyes: Negative.  Negative for blurred vision, double vision and photophobia.  Respiratory: Negative.  Negative for cough and shortness of breath.   Cardiovascular:  Negative.  Negative for chest pain, palpitations and leg swelling.  Gastrointestinal: Negative.  Negative for heartburn, nausea and vomiting.  Musculoskeletal: Negative.  Negative for myalgias.  Neurological: Negative.  Negative for dizziness, focal weakness, seizures and headaches.  Psychiatric/Behavioral: Negative.  Negative for suicidal ideas.     Past Medical History:  Diagnosis Date   COVID-19 virus infection 12/12/2018   Diabetes mellitus without complication (HCC) 2009   Hearing deficit    Hyperlipidemia    Hypertension 2008    History reviewed. No pertinent surgical history.  Family History  Problem Relation Age of Onset   Diabetes Mother    Hypertension Mother    Diabetes Father    Hypertension Father    Cancer Neg Hx    Heart disease Neg Hx     Social History Reviewed with no changes to be made today.   Outpatient Medications Prior to Visit  Medication Sig Dispense Refill   Blood Glucose Monitoring Suppl (TRUE METRIX METER) w/Device KIT USE AS DIRECTED 2 TIMES DAILY 1 kit 0   fluticasone  (FLONASE ) 50 MCG/ACT nasal spray Place 2 sprays into both nostrils daily. 16 g 6   glucose blood (TRUE METRIX BLOOD GLUCOSE TEST) test strip Use to check blood sugar once daily. (Must have office visit for refills) 100 each 6   TRUEplus Lancets 28G MISC USE AS DIRECTED ONCE DAILY 100 each 6   atorvastatin  (LIPITOR) 40 MG tablet Take 1 tablet (40 mg total) by mouth daily at 6 PM for cholesterol 90 tablet 3   diltiazem  (CARDIZEM  CD) 180 MG 24 hr capsule Take 1 capsule (180 mg total) by mouth daily for blood pressure 90 capsule 1   glimepiride  (AMARYL ) 4 MG tablet Take 1 tablet (4 mg total) by mouth daily before breakfast. 90 tablet 1   hydrochlorothiazide  (MICROZIDE ) 12.5 MG capsule Take 1 capsule (12.5 mg total) by mouth daily. 90 capsule 3   lisinopril  (ZESTRIL ) 40 MG tablet Take 1 tablet (40 mg total) by mouth daily. 90 tablet 1   loratadine  (CLARITIN ) 10 MG tablet Take 1 tablet (10  mg total) by mouth daily. For allergies 90 tablet 3   metFORMIN  (GLUCOPHAGE -XR) 500 MG 24 hr tablet Take 2 tablets (1,000 mg total) by mouth 2 (two) times daily with a meal. 360 tablet 1   No facility-administered medications prior to visit.    Allergies  Allergen Reactions   Pollen Extract     Hear swelling       Objective:    BP 119/77 (BP Location: Left Arm, Patient Position: Sitting, Cuff Size: Normal)   Pulse 77   Ht 5' 5 (1.651 m)   Wt 187 lb 12.8 oz (85.2 kg)   LMP  (LMP Unknown)   SpO2 98%   BMI 31.25 kg/m  Wt Readings from Last 3 Encounters:  06/21/23 187 lb 12.8 oz (85.2 kg)  03/21/23 187 lb (84.8 kg)  12/15/22 176 lb (79.8 kg)    Physical Exam Vitals and nursing note reviewed.  Constitutional:      Appearance: She is well-developed.  HENT:     Head: Normocephalic and atraumatic.  Cardiovascular:     Rate and Rhythm: Normal rate and regular rhythm.     Heart sounds: Normal heart sounds. No murmur heard.    No friction rub. No gallop.  Pulmonary:     Effort: Pulmonary effort is normal. No tachypnea or respiratory distress.     Breath sounds: Normal breath sounds. No decreased breath sounds, wheezing, rhonchi or rales.  Chest:     Chest wall: No tenderness.  Abdominal:     General: Bowel sounds are normal.     Palpations: Abdomen is soft.  Musculoskeletal:        General: Normal range of motion.     Cervical back: Normal range of motion.  Skin:    General: Skin is warm and dry.  Neurological:     Mental Status: She is alert and oriented to person, place, and time.     Coordination: Coordination normal.  Psychiatric:        Behavior: Behavior normal. Behavior is cooperative.        Thought Content: Thought content normal.        Judgment: Judgment normal.          Patient has been counseled extensively about nutrition and exercise as well as the importance of adherence with medications and regular follow-up. The patient was given clear  instructions to go to ER or return to medical center if symptoms don't improve, worsen or new problems develop. The patient verbalized understanding.   Follow-up: Return in about 3 months (around 09/19/2023).   Haze LELON Servant, FNP-BC [facility] [place], [place] [phone]   06/21/2023, 4:53 PM

## 2023-06-21 NOTE — Patient Instructions (Addendum)
 USERNAME  [email or web address]   You will have to make up your own new password

## 2023-06-22 LAB — CMP14+EGFR
ALT: 22 [IU]/L (ref 0–32)
AST: 17 [IU]/L (ref 0–40)
Albumin: 4.2 g/dL (ref 3.8–4.9)
Alkaline Phosphatase: 111 [IU]/L (ref 44–121)
BUN/Creatinine Ratio: 22 (ref 9–23)
BUN: 15 mg/dL (ref 6–24)
Bilirubin Total: 0.4 mg/dL (ref 0.0–1.2)
CO2: 24 mmol/L (ref 20–29)
Calcium: 8.9 mg/dL (ref 8.7–10.2)
Chloride: 103 mmol/L (ref 96–106)
Creatinine, Ser: 0.68 mg/dL (ref 0.57–1.00)
Globulin, Total: 2.8 g/dL (ref 1.5–4.5)
Glucose: 105 mg/dL — ABNORMAL HIGH (ref 70–99)
Potassium: 3.8 mmol/L (ref 3.5–5.2)
Sodium: 139 mmol/L (ref 134–144)
Total Protein: 7 g/dL (ref 6.0–8.5)
eGFR: 105 mL/min/{1.73_m2} (ref >59)

## 2023-06-22 LAB — CBC WITH DIFFERENTIAL/PLATELET
Basophils Absolute: 0 10*3/uL (ref 0.0–0.2)
Basos: 1 %
EOS (ABSOLUTE): 0.1 10*3/uL (ref 0.0–0.4)
Eos: 2 %
Hematocrit: 42.2 % (ref 34.0–46.6)
Hemoglobin: 14.3 g/dL (ref 11.1–15.9)
Immature Grans (Abs): 0 10*3/uL (ref 0.0–0.1)
Immature Granulocytes: 0 %
Lymphocytes Absolute: 1.7 10*3/uL (ref 0.7–3.1)
Lymphs: 27 %
MCH: 32.2 pg (ref 26.6–33.0)
MCHC: 33.9 g/dL (ref 31.5–35.7)
MCV: 95 fL (ref 79–97)
Monocytes Absolute: 0.7 10*3/uL (ref 0.1–0.9)
Monocytes: 10 %
Neutrophils Absolute: 3.9 10*3/uL (ref 1.4–7.0)
Neutrophils: 60 %
Platelets: 232 10*3/uL (ref 150–450)
RBC: 4.44 x10E6/uL (ref 3.77–5.28)
RDW: 13 % (ref 11.7–15.4)
WBC: 6.5 10*3/uL (ref 3.4–10.8)

## 2023-07-04 ENCOUNTER — Other Ambulatory Visit: Payer: Self-pay

## 2023-07-05 ENCOUNTER — Encounter: Payer: Self-pay | Admitting: Nurse Practitioner

## 2023-07-05 ENCOUNTER — Other Ambulatory Visit: Payer: Self-pay

## 2023-07-18 ENCOUNTER — Other Ambulatory Visit: Payer: Self-pay

## 2023-07-19 ENCOUNTER — Other Ambulatory Visit: Payer: Self-pay

## 2023-07-25 IMAGING — MG MM DIGITAL SCREENING BILAT W/ TOMO AND CAD
6 of 10 series · 6 of 30 positions shown · non-contrast
Comparison: Previous exam(s).

CLINICAL DATA: Screening.

EXAM:
DIGITAL SCREENING BILATERAL MAMMOGRAM WITH TOMOSYNTHESIS AND CAD
TECHNIQUE: Bilateral screening digital craniocaudal and mediolateral oblique
mammograms were obtained. Bilateral screening digital breast
tomosynthesis was performed. The images were evaluated with
computer-aided detection.

[L MLO synth-2D]
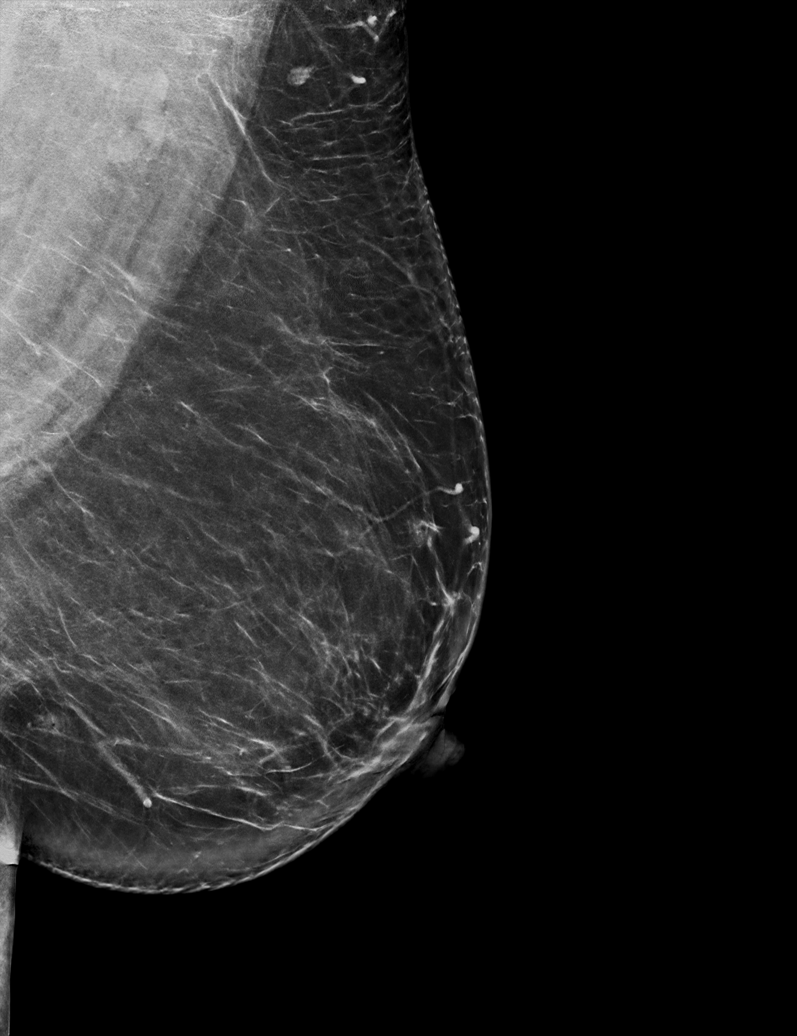

[R CC synth-2D]
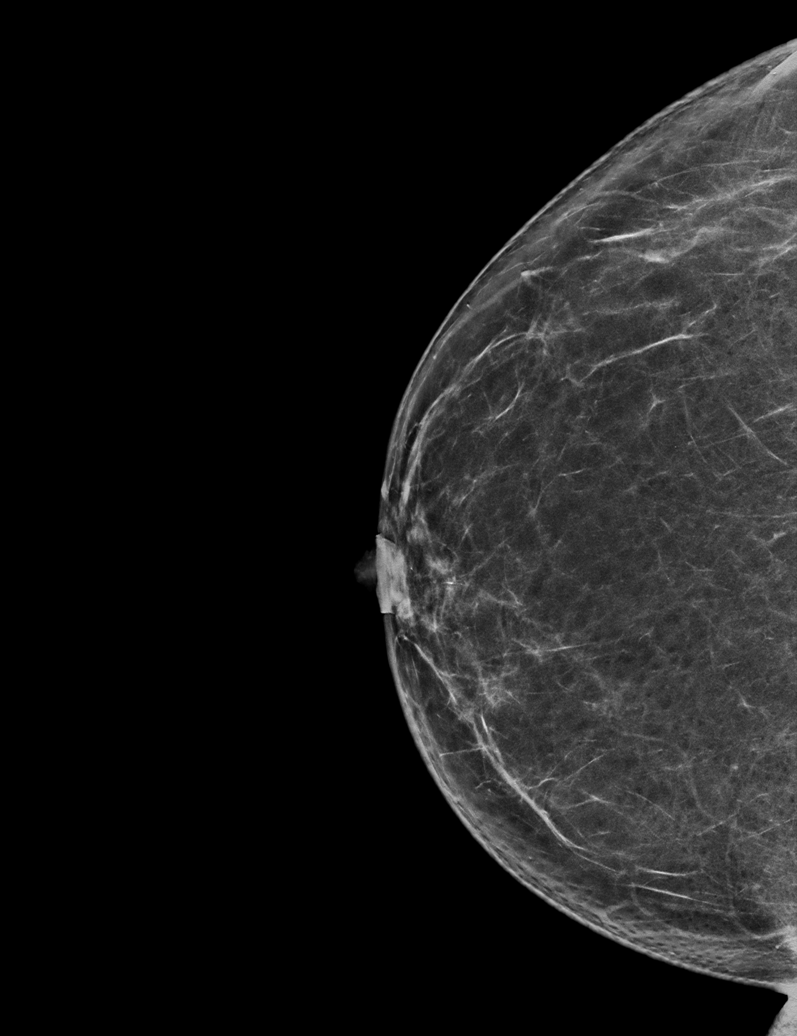

[L CC synth-2D (1 of 2)]
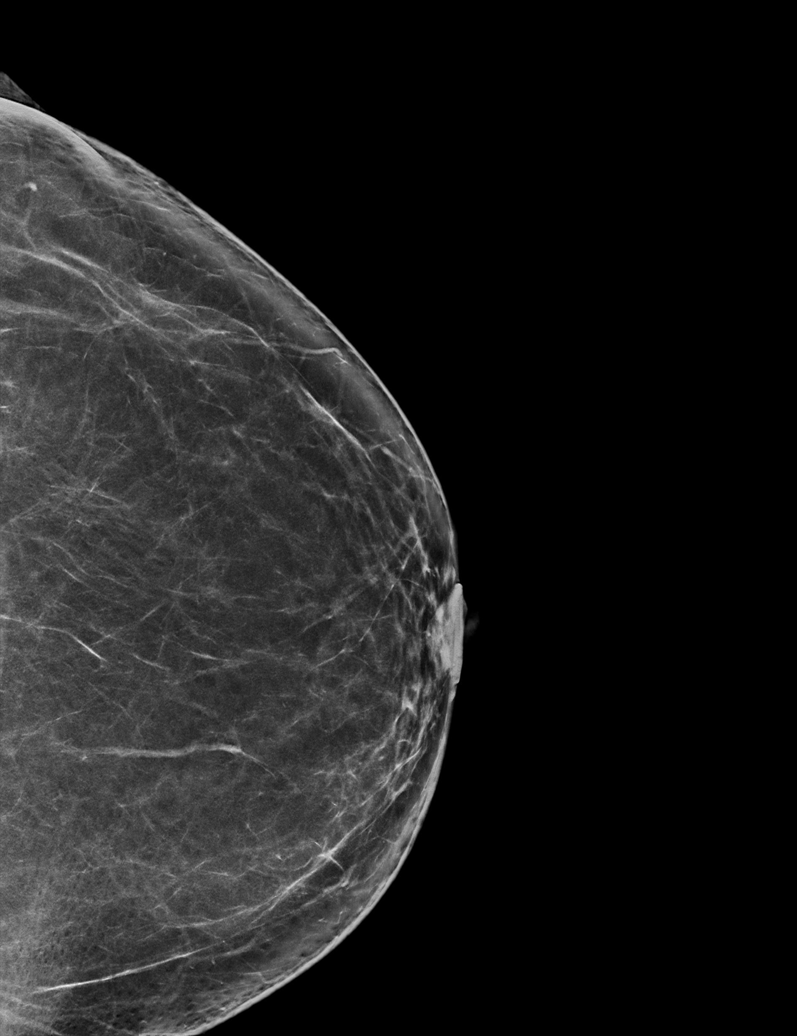

[L CC synth-2D (2 of 2)]
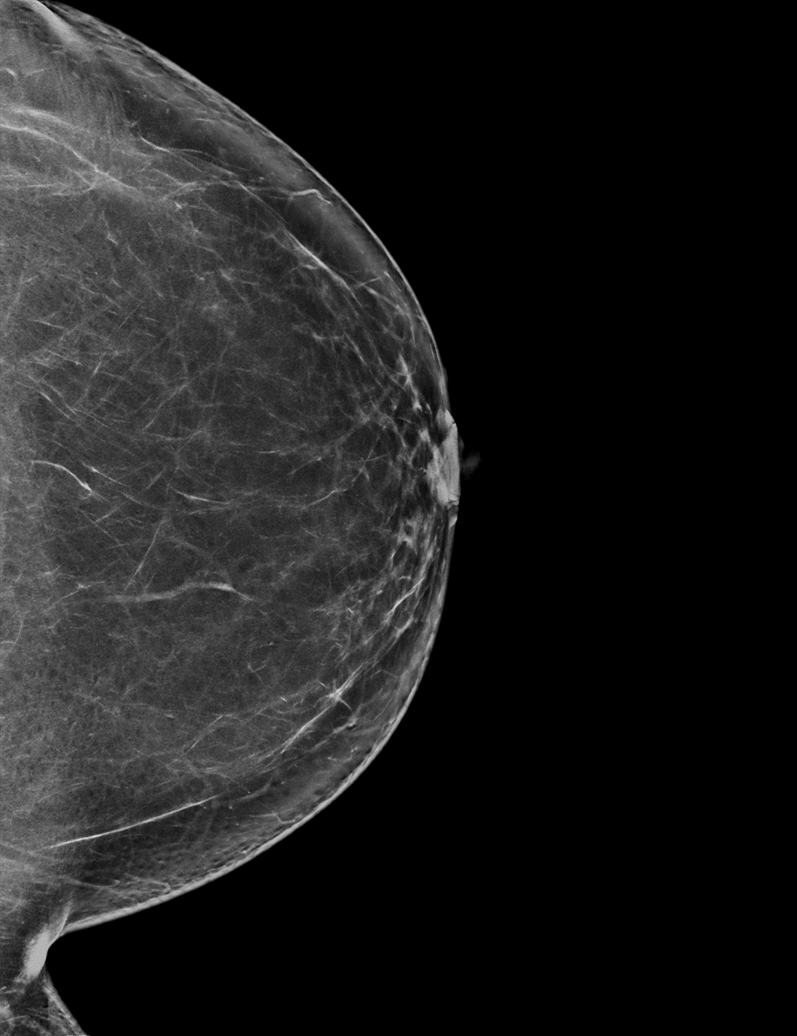

[R MLO synth-2D]
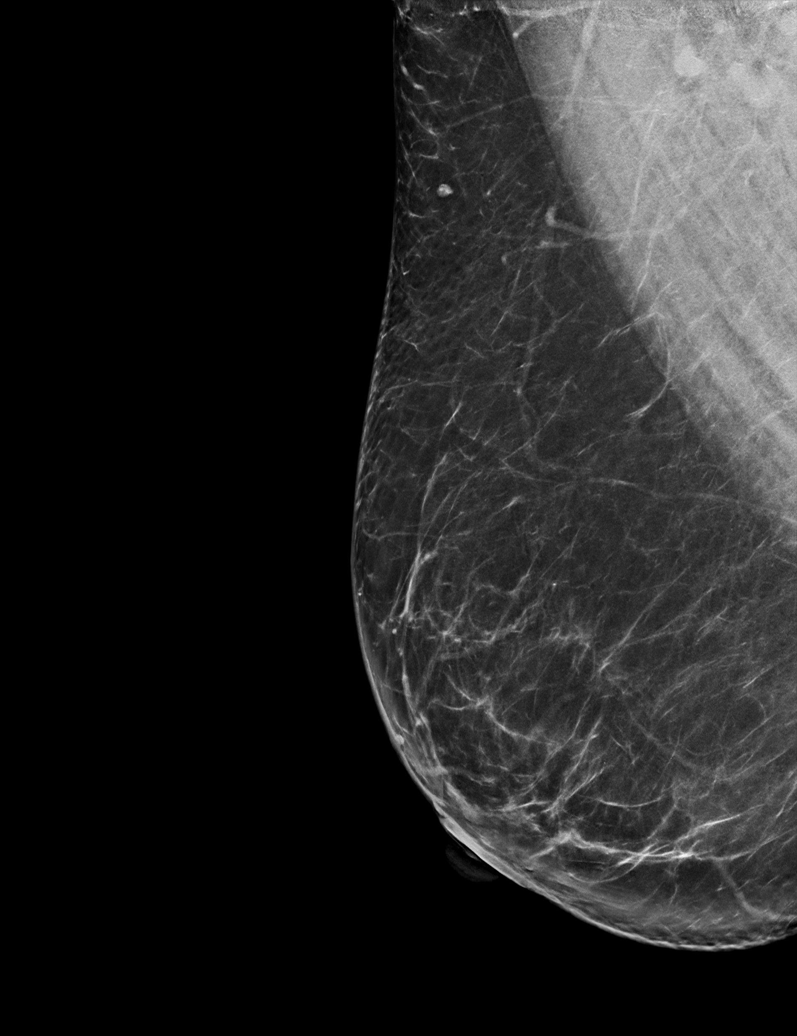

[R MLO tomo · tomo slice 43/85.0]
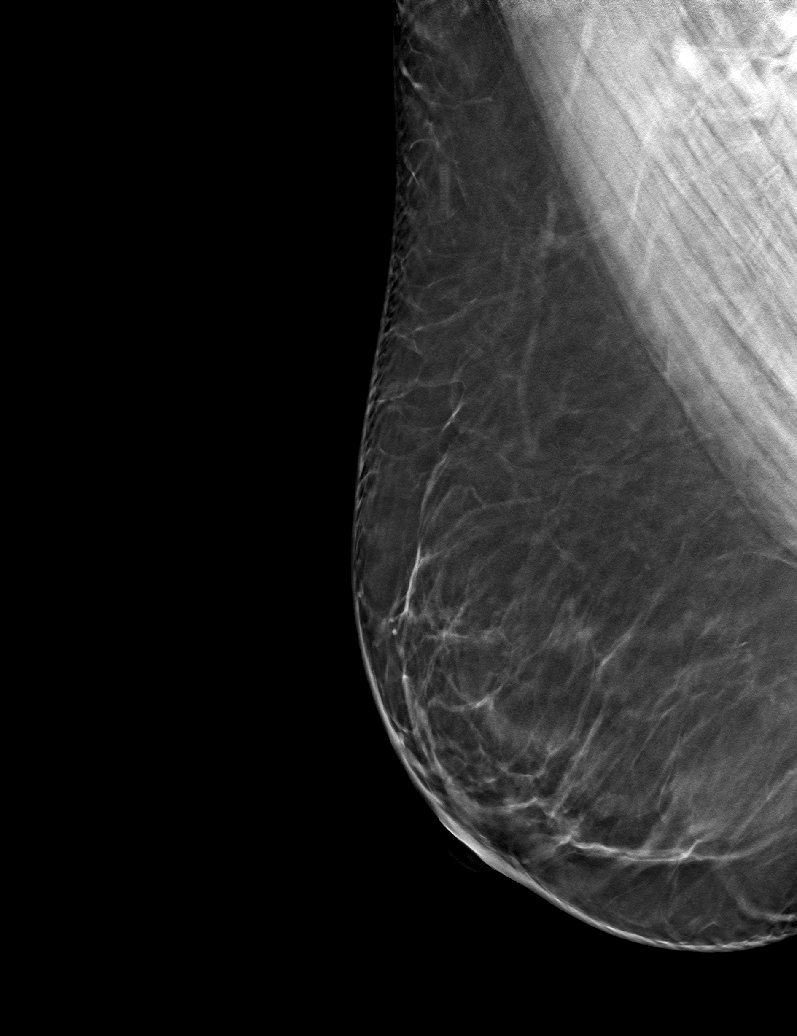

[6 of 30 positions shown; findings below may reference images not displayed]

ACR Breast Density Category b: There are scattered areas of
fibroglandular density.
FINDINGS: There are no findings suspicious for malignancy.
IMPRESSION: No mammographic evidence of malignancy. A result letter of this
screening mammogram will be mailed directly to the patient.

RECOMMENDATION:
Screening mammogram in one year. (Code:51-O-LD2)

BI-RADS CATEGORY  1: Negative.

## 2023-08-11 ENCOUNTER — Telehealth: Payer: Self-pay | Admitting: Nurse Practitioner

## 2023-08-11 ENCOUNTER — Ambulatory Visit: Payer: Self-pay | Admitting: Nurse Practitioner

## 2023-08-11 ENCOUNTER — Other Ambulatory Visit: Payer: Self-pay

## 2023-08-11 NOTE — Telephone Encounter (Unsigned)
 Copied from CRM [id number]. Topic: Clinical - Medication Refill >> Aug 11, 2023  9:31 AM Higinio Roger wrote: Most Recent Primary Care Visit:  Provider: Bertram Denver W  Department: [facility]  Visit Type: OFFICE VISIT  Date: 06/21/2023  Medication: Medication for Cough and sore throat for 2 weeks  Has the patient contacted their pharmacy? No (Agent: If no, request that the patient contact the pharmacy for the refill. If patient does not wish to contact the pharmacy document the reason why and proceed with request.) (Agent: If yes, when and what did the pharmacy advise?)  Is this the correct pharmacy for this prescription? Yes If no, delete pharmacy and type the correct one.  This is the patient's preferred pharmacy:   [facility] [address], [address] [place] [address] Phone: [phone] Fax: [phone]   Has the prescription been filled recently? No  Is the patient out of the medication? Yes  Has the patient been seen for an appointment in the last year OR does the patient have an upcoming appointment? Yes  Can we respond through MyChart? Yes  Agent: Please be advised that Rx refills may take up to 3 business days. We ask that you follow-up with your pharmacy.

## 2023-08-11 NOTE — Telephone Encounter (Signed)
 opied from CRM [id number]. Topic: Clinical - Medication Refill >> Aug 11, 2023  9:31 AM Higinio Roger wrote: Most Recent Primary Care Visit:  Provider: Bertram Denver W  Department: [facility]  Visit Type: OFFICE VISIT  Date: 06/21/2023   Medication: Medication for Cough and sore throat for 2 weeks   Has the patient contacted their pharmacy? No (Agent: If no, request that the patient contact the pharmacy for the refill. If patient does not wish to contact the pharmacy document the reason why and proceed with request.) (Agent: If yes, when and what did the pharmacy advise?)   Is this the correct pharmacy for this prescription? Yes If no, delete pharmacy and type the correct one.  This is the patient's preferred pharmacy:    [facility] [address] [place] [address] Phone: [phone] Fax: [phone]     Has the prescription been filled recently? No   Is the patient out of the medication? Yes   Has the patient been seen for an appointment in the last year OR does the patient have an upcoming appointment? Yes   Can we respond through MyChart? Yes   Agent: Please be advised that Rx refills may take up to 3 business days. We ask that you follow-up with your pharmacy.  Additional Notes: This Triage RN attempted to contact the patient, with the interpreter on the line, no answer at this time, left a message with the interpreter on the line.

## 2023-08-12 ENCOUNTER — Other Ambulatory Visit: Payer: Self-pay | Admitting: Nurse Practitioner

## 2023-08-12 ENCOUNTER — Other Ambulatory Visit: Payer: Self-pay

## 2023-08-12 DIAGNOSIS — J069 Acute upper respiratory infection, unspecified: Secondary | ICD-10-CM

## 2023-08-12 MED ORDER — PSEUDOEPH-BROMPHEN-DM 30-2-10 MG/5ML PO SYRP
5.0000 mL | ORAL_SOLUTION | Freq: Four times a day (QID) | ORAL | 0 refills | Status: DC | PRN
  Filled 2023-08-12: qty 240, 12d supply, fill #0

## 2023-08-12 NOTE — Telephone Encounter (Signed)
 She can take tylenol and motrin for sore throat, warm salt water gargles, warm tea with honey. Cough syrup has been sent.

## 2023-08-17 NOTE — Telephone Encounter (Signed)
Patient has been informed of medication being sent

## 2023-08-18 ENCOUNTER — Other Ambulatory Visit: Payer: Self-pay

## 2023-08-30 ENCOUNTER — Ambulatory Visit
Admission: RE | Admit: 2023-08-30 | Discharge: 2023-08-30 | Disposition: A | Discharge status: Home / Self Care | Payer: No Typology Code available for payment source | Source: Ambulatory Visit | Attending: Nurse Practitioner

## 2023-08-30 DIAGNOSIS — Z1231 Encounter for screening mammogram for malignant neoplasm of breast: Secondary | ICD-10-CM

## 2023-09-04 ENCOUNTER — Encounter: Payer: Self-pay | Admitting: Nurse Practitioner

## 2023-09-20 ENCOUNTER — Ambulatory Visit: Payer: Self-pay | Attending: Nurse Practitioner | Admitting: Nurse Practitioner

## 2023-09-20 ENCOUNTER — Encounter: Payer: Self-pay | Admitting: Nurse Practitioner

## 2023-09-20 ENCOUNTER — Other Ambulatory Visit: Payer: Self-pay

## 2023-09-20 VITALS — BP 121/78 | HR 78 | Resp 19 | Ht 65.0 in | Wt 186.4 lb

## 2023-09-20 DIAGNOSIS — E785 Hyperlipidemia, unspecified: Secondary | ICD-10-CM

## 2023-09-20 DIAGNOSIS — E1165 Type 2 diabetes mellitus with hyperglycemia: Secondary | ICD-10-CM

## 2023-09-20 DIAGNOSIS — J3089 Other allergic rhinitis: Secondary | ICD-10-CM

## 2023-09-20 DIAGNOSIS — Z23 Encounter for immunization: Secondary | ICD-10-CM

## 2023-09-20 DIAGNOSIS — E1169 Type 2 diabetes mellitus with other specified complication: Secondary | ICD-10-CM

## 2023-09-20 DIAGNOSIS — I10 Essential (primary) hypertension: Secondary | ICD-10-CM

## 2023-09-20 DIAGNOSIS — Z7984 Long term (current) use of oral hypoglycemic drugs: Secondary | ICD-10-CM

## 2023-09-20 LAB — POCT GLYCOSYLATED HEMOGLOBIN (HGB A1C): Hemoglobin A1C: 7 % — AB (ref 4.0–5.6)

## 2023-09-20 MED ORDER — ATORVASTATIN CALCIUM 40 MG PO TABS
40.0000 mg | ORAL_TABLET | Freq: Every day | ORAL | 3 refills | Status: DC
  Filled 2023-09-20 – 2023-10-13 (×2): qty 90, 90d supply, fill #0

## 2023-09-20 MED ORDER — LISINOPRIL 40 MG PO TABS
40.0000 mg | ORAL_TABLET | Freq: Every day | ORAL | 1 refills | Status: DC
  Filled 2023-09-20: qty 90, 90d supply, fill #0

## 2023-09-20 MED ORDER — DILTIAZEM HCL ER COATED BEADS 180 MG PO CP24
180.0000 mg | ORAL_CAPSULE | Freq: Every day | ORAL | 1 refills | Status: DC
  Filled 2023-09-20 – 2023-09-28 (×3): qty 90, 90d supply, fill #0
  Filled 2023-12-26: qty 90, 90d supply, fill #1

## 2023-09-20 MED ORDER — TRUEPLUS LANCETS 28G MISC
6 refills | Status: AC
  Filled 2023-09-20: qty 100, fill #0
  Filled 2023-09-28: qty 100, 100d supply, fill #0
  Filled 2023-12-07: qty 100, 100d supply, fill #1
  Filled 2024-02-28: qty 100, 100d supply, fill #2
  Filled 2024-06-01: qty 100, 100d supply, fill #3

## 2023-09-20 MED ORDER — GLIMEPIRIDE 4 MG PO TABS
4.0000 mg | ORAL_TABLET | Freq: Every day | ORAL | 1 refills | Status: DC
Start: 2023-09-20 — End: 2023-12-20
  Filled 2023-09-20: qty 90, 90d supply, fill #0

## 2023-09-20 MED ORDER — ATORVASTATIN CALCIUM 40 MG PO TABS
40.0000 mg | ORAL_TABLET | Freq: Every day | ORAL | 3 refills | Status: DC
  Filled 2023-09-20: qty 90, 90d supply, fill #0

## 2023-09-20 MED ORDER — LORATADINE 10 MG PO TABS
10.0000 mg | ORAL_TABLET | Freq: Every day | ORAL | 3 refills | Status: DC
Start: 2023-09-20 — End: 2023-12-20
  Filled 2023-09-20: qty 30, 30d supply, fill #0
  Filled 2023-12-07: qty 30, 30d supply, fill #1

## 2023-09-20 MED ORDER — HYDROCHLOROTHIAZIDE 12.5 MG PO CAPS
12.5000 mg | ORAL_CAPSULE | Freq: Every day | ORAL | 3 refills | Status: DC
Start: 2023-09-20 — End: 2023-12-20
  Filled 2023-09-20: qty 90, 90d supply, fill #0

## 2023-09-20 NOTE — Progress Notes (Signed)
 Assessment & Plan:  Isabella Joyce was seen today for medical management of chronic issues.  Diagnoses and all orders for this visit:  Type 2 diabetes mellitus with hyperglycemia, without long-term current use of insulin (HCC) -     POCT glycosylated hemoglobin (Hb A1C) -     glimepiride (AMARYL) 4 MG tablet; Take 1 tablet (4 mg total) by mouth daily before breakfast. For diabetes. -     TRUEplus Lancets 28G MISC; USE AS DIRECTED ONCE DAILY  Primary hypertension -     diltiazem (CARDIZEM CD) 180 MG 24 hr capsule; Take 1 capsule (180 mg total) by mouth daily. For blood pressure. Please fill as a 90 day supply -     hydrochlorothiazide (MICROZIDE) 12.5 MG capsule; Take 1 capsule (12.5 mg total) by mouth daily. For hypertension. Please fill as a 90 day supply -     lisinopril (ZESTRIL) 40 MG tablet; Take 1 tablet (40 mg total) by mouth daily. For blood pressure. Please fill as a 90 day supply  Hyperlipidemia associated with type 2 diabetes mellitus (HCC) -     Discontinue: atorvastatin (LIPITOR) 40 MG tablet; Take 1 tablet (40 mg total) by mouth daily at 6 PM. -     atorvastatin (LIPITOR) 40 MG tablet; Take 1 tablet (40 mg total) by mouth daily at 6 PM. For cholesterol  Environmental and seasonal allergies -     loratadine (CLARITIN) 10 MG tablet; Take 1 tablet (10 mg total) by mouth daily. For allergies  Need for pneumococcal 20-valent conjugate vaccination -     Pneumococcal conjugate vaccine 20-valent    Patient has been counseled on age-appropriate routine health concerns for screening and prevention. These are reviewed and up-to-date. Referrals have been placed accordingly. Immunizations are up-to-date or declined.    Subjective:   Chief Complaint  Patient presents with   Medical Management of Chronic Issues    Isabella Joyce 53 y.o. female presents to office today for follow up to DM and HTN.  He has a past medical history of COVID-19 virus infection (12/12/2018), DM 2  (2009), Hearing deficit, Hyperlipidemia, and Hypertension (2008).      DM 2 A1c has improved from 7.1 to 7.1. She is currently taking glimepiride 4 mg daily and metformin 1000 mg BID as prescribed. Lab Results  Component Value Date   HGBA1C 7.0 (A) 09/20/2023     HTN Blood pressure is well controlled. She is taking cardizem 180 mg daily, lisinopril  40 mg daily BP Readings from Last 3 Encounters:  09/20/23 121/78  06/21/23 119/77  03/21/23 (!) 142/98     Lab Results  Component Value Date   HGBA1C 7.0 (A) 09/20/2023    Lab Results  Component Value Date   HGBA1C 7.1 (A) 06/21/2023        Review of Systems  Constitutional:  Negative for fever, malaise/fatigue and weight loss.  HENT: Negative.  Negative for nosebleeds.   Eyes: Negative.  Negative for blurred vision, double vision and photophobia.  Respiratory: Negative.  Negative for cough and shortness of breath.   Cardiovascular: Negative.  Negative for chest pain, palpitations and leg swelling.  Gastrointestinal: Negative.  Negative for heartburn, nausea and vomiting.  Musculoskeletal: Negative.  Negative for myalgias.  Neurological: Negative.  Negative for dizziness, focal weakness, seizures and headaches.  Endo/Heme/Allergies:  Positive for environmental allergies (symptoms well controlled. no longer using nasal spray).  Psychiatric/Behavioral: Negative.  Negative for suicidal ideas.     Past Medical History:  Diagnosis Date   COVID-19 virus infection 12/12/2018   Diabetes mellitus without complication (HCC) 2009   Hearing deficit    Hyperlipidemia    Hypertension 2008    No past surgical history on file.  Family History  Problem Relation Age of Onset   Diabetes Mother    Hypertension Mother    Diabetes Father    Hypertension Father    Cancer Neg Hx    Heart disease Neg Hx     Social History Reviewed with no changes to be made today.   Outpatient Medications Prior to Visit  Medication Sig Dispense  Refill   Blood Glucose Monitoring Suppl (TRUE METRIX METER) w/Device KIT USE AS DIRECTED 2 TIMES DAILY 1 kit 0   glucose blood (TRUE METRIX BLOOD GLUCOSE TEST) test strip Use to check blood sugar once daily. (Must have office visit for refills) 100 each 6   metFORMIN (GLUCOPHAGE-XR) 500 MG 24 hr tablet Take 2 tablets (1,000 mg total) by mouth 2 (two) times daily with a meal. FOR DIABETES. Please fill as a 90 day supply 360 tablet 1   atorvastatin (LIPITOR) 40 MG tablet Take 1 tablet (40 mg total) by mouth daily at 6 PM. 90 tablet 3   diltiazem (CARDIZEM CD) 180 MG 24 hr capsule Take 1 capsule (180 mg total) by mouth daily. For blood pressure. Please fill as a 90 day supply 90 capsule 1   glimepiride (AMARYL) 4 MG tablet Take 1 tablet (4 mg total) by mouth daily before breakfast. For diabetes. Please fill as a 90 day supply 90 tablet 1   hydrochlorothiazide (MICROZIDE) 12.5 MG capsule Take 1 capsule (12.5 mg total) by mouth daily. For hypertension. Please fill as a 90 day supply 90 capsule 3   lisinopril (ZESTRIL) 40 MG tablet Take 1 tablet (40 mg total) by mouth daily. For blood pressure. Please fill as a 90 day supply 90 tablet 1   loratadine (CLARITIN) 10 MG tablet Take 1 tablet (10 mg total) by mouth daily. For allergies 90 tablet 3   TRUEplus Lancets 28G MISC USE AS DIRECTED ONCE DAILY 100 each 6   brompheniramine-pseudoephedrine-DM 30-2-10 MG/5ML syrup Take 5 mLs by mouth 4 (four) times daily as needed. (Patient not taking: Reported on 09/20/2023) 240 mL 0   fluticasone (FLONASE) 50 MCG/ACT nasal spray Place 2 sprays into both nostrils daily. (Patient not taking: Reported on 09/20/2023) 16 g 6   No facility-administered medications prior to visit.    Allergies  Allergen Reactions   Pollen Extract     Hear swelling       Objective:    BP 121/78 (BP Location: Left Arm, Patient Position: Sitting, Cuff Size: Normal)   Pulse 78   Resp 19   Ht 5\' 5"  (1.651 m)   Wt 186 lb 6.4 oz (84.6 kg)    SpO2 98%   BMI 31.02 kg/m  Wt Readings from Last 3 Encounters:  09/20/23 186 lb 6.4 oz (84.6 kg)  06/21/23 187 lb 12.8 oz (85.2 kg)  03/21/23 187 lb (84.8 kg)    Physical Exam Vitals and nursing note reviewed.  Constitutional:      Appearance: She is well-developed.  HENT:     Head: Normocephalic and atraumatic.  Cardiovascular:     Rate and Rhythm: Normal rate and regular rhythm.     Heart sounds: Normal heart sounds. No murmur heard.    No friction rub. No gallop.  Pulmonary:     Effort: Pulmonary effort is  normal. No tachypnea or respiratory distress.     Breath sounds: Normal breath sounds. No decreased breath sounds, wheezing, rhonchi or rales.  Chest:     Chest wall: No tenderness.  Abdominal:     General: Bowel sounds are normal.     Palpations: Abdomen is soft.  Musculoskeletal:        General: Normal range of motion.     Cervical back: Normal range of motion.  Skin:    General: Skin is warm and dry.  Neurological:     Mental Status: She is alert and oriented to person, place, and time.     Coordination: Coordination normal.  Psychiatric:        Behavior: Behavior normal. Behavior is cooperative.        Thought Content: Thought content normal.        Judgment: Judgment normal.          Patient has been counseled extensively about nutrition and exercise as well as the importance of adherence with medications and regular follow-up. The patient was given clear instructions to go to ER or return to medical center if symptoms don't improve, worsen or new problems develop. The patient verbalized understanding.   Follow-up: Return in about 3 months (around 12/20/2023).   Claiborne Rigg, FNP-BC [facility] [place], [place] [phone]   09/20/2023, 5:13 PM

## 2023-09-28 ENCOUNTER — Other Ambulatory Visit: Payer: Self-pay

## 2023-10-13 ENCOUNTER — Other Ambulatory Visit: Payer: Self-pay

## 2023-10-19 ENCOUNTER — Other Ambulatory Visit: Payer: Self-pay

## 2023-12-07 ENCOUNTER — Other Ambulatory Visit: Payer: Self-pay | Admitting: Nurse Practitioner

## 2023-12-07 ENCOUNTER — Other Ambulatory Visit: Payer: Self-pay

## 2023-12-07 DIAGNOSIS — E1165 Type 2 diabetes mellitus with hyperglycemia: Secondary | ICD-10-CM

## 2023-12-07 MED ORDER — TRUE METRIX BLOOD GLUCOSE TEST VI STRP
ORAL_STRIP | 6 refills | Status: AC
  Filled 2023-12-07: qty 100, 100d supply, fill #0
  Filled 2024-02-28: qty 100, 100d supply, fill #1
  Filled 2024-06-01: qty 100, 100d supply, fill #2

## 2023-12-09 ENCOUNTER — Other Ambulatory Visit: Payer: Self-pay

## 2023-12-19 ENCOUNTER — Telehealth: Payer: Self-pay | Admitting: Nurse Practitioner

## 2023-12-19 NOTE — Telephone Encounter (Signed)
 Called pt to confirm appt. Pt family member said they will be present.

## 2023-12-20 ENCOUNTER — Ambulatory Visit: Payer: Self-pay | Attending: Nurse Practitioner | Admitting: Nurse Practitioner

## 2023-12-20 ENCOUNTER — Other Ambulatory Visit: Payer: Self-pay

## 2023-12-20 ENCOUNTER — Encounter: Payer: Self-pay | Admitting: Nurse Practitioner

## 2023-12-20 VITALS — BP 114/75 | HR 74 | Resp 19 | Ht 65.0 in | Wt 182.0 lb

## 2023-12-20 DIAGNOSIS — I10 Essential (primary) hypertension: Secondary | ICD-10-CM

## 2023-12-20 DIAGNOSIS — Z1211 Encounter for screening for malignant neoplasm of colon: Secondary | ICD-10-CM

## 2023-12-20 DIAGNOSIS — E1165 Type 2 diabetes mellitus with hyperglycemia: Secondary | ICD-10-CM

## 2023-12-20 DIAGNOSIS — Z7984 Long term (current) use of oral hypoglycemic drugs: Secondary | ICD-10-CM

## 2023-12-20 DIAGNOSIS — E119 Type 2 diabetes mellitus without complications: Secondary | ICD-10-CM

## 2023-12-20 DIAGNOSIS — J3089 Other allergic rhinitis: Secondary | ICD-10-CM

## 2023-12-20 DIAGNOSIS — E1169 Type 2 diabetes mellitus with other specified complication: Secondary | ICD-10-CM

## 2023-12-20 LAB — POCT GLYCOSYLATED HEMOGLOBIN (HGB A1C): Hemoglobin A1C: 7.5 % — AB (ref 4.0–5.6)

## 2023-12-20 MED ORDER — ATORVASTATIN CALCIUM 40 MG PO TABS
40.0000 mg | ORAL_TABLET | Freq: Every day | ORAL | 3 refills | Status: AC
  Filled 2023-12-20 – 2024-01-11 (×2): qty 90, 90d supply, fill #0
  Filled 2024-04-04 (×2): qty 90, 90d supply, fill #1
  Filled 2024-07-03: qty 90, 90d supply, fill #2

## 2023-12-20 MED ORDER — LISINOPRIL 40 MG PO TABS
40.0000 mg | ORAL_TABLET | Freq: Every day | ORAL | 1 refills | Status: DC
  Filled 2023-12-20: qty 90, 90d supply, fill #0
  Filled 2024-03-17 – 2024-03-19 (×2): qty 90, 90d supply, fill #1

## 2023-12-20 MED ORDER — METFORMIN HCL ER 500 MG PO TB24
1000.0000 mg | ORAL_TABLET | Freq: Two times a day (BID) | ORAL | 1 refills | Status: DC
  Filled 2023-12-20: qty 120, 30d supply, fill #0
  Filled 2024-03-17: qty 120, 30d supply, fill #1
  Filled 2024-03-19: qty 360, 90d supply, fill #1

## 2023-12-20 MED ORDER — LORATADINE 10 MG PO TABS
10.0000 mg | ORAL_TABLET | Freq: Every day | ORAL | 3 refills | Status: AC
  Filled 2023-12-20 – 2023-12-30 (×2): qty 90, 90d supply, fill #0
  Filled 2024-03-27: qty 90, 90d supply, fill #1
  Filled 2024-06-19: qty 90, 90d supply, fill #2

## 2023-12-20 MED ORDER — GLIMEPIRIDE 4 MG PO TABS
4.0000 mg | ORAL_TABLET | Freq: Every day | ORAL | 1 refills | Status: DC
  Filled 2023-12-20: qty 90, 90d supply, fill #0
  Filled 2024-03-17 – 2024-03-19 (×2): qty 90, 90d supply, fill #1

## 2023-12-20 MED ORDER — HYDROCHLOROTHIAZIDE 12.5 MG PO CAPS
12.5000 mg | ORAL_CAPSULE | Freq: Every day | ORAL | 3 refills | Status: AC
  Filled 2023-12-20: qty 90, 90d supply, fill #0
  Filled 2024-03-17 – 2024-03-19 (×2): qty 90, 90d supply, fill #1
  Filled 2024-06-19: qty 90, 90d supply, fill #2

## 2023-12-20 NOTE — Patient Instructions (Signed)
 VISIT SUMMARY:  Today, we reviewed your diabetes management and discussed your recent A1c levels. Your A1c has increased slightly from 7.0 to 7.5, indicating that your blood sugar control has worsened a bit. We also reviewed your recent mammogram results, which were normal.  YOUR PLAN:  -TYPE 2 DIABETES MELLITUS: Type 2 diabetes is a condition where your body does not use insulin properly, leading to high blood sugar levels. Your A1c level has increased to 7.5%, which means your blood sugar control is not optimal. We discussed starting a medication called Ozempic to help with weight loss and blood sugar control, but you decided to try lifestyle changes first. We will give it three months to see if these changes help improve your A1c. If not, we will consider starting Ozempic. Make sure you have a 90-day supply of your current diabetes medications at the pharmacy.  INSTRUCTIONS:  Please follow up in three months to recheck your A1c levels and assess the effectiveness of your lifestyle modifications. If your A1c is still elevated, we will discuss starting Ozempic at that time.

## 2023-12-20 NOTE — Progress Notes (Signed)
 Assessment and Plan   Type 2 Diabetes Mellitus HbA1c increased to 7.5%, indicating suboptimal control. Discussed Ozempic for weight loss and glycemic control. Isabella Joyce opted for lifestyle modifications first. - Allow three months for lifestyle modifications to improve HbA1c. - If HbA1c remains elevated after three months, initiate Ozempic. - Ensure a 90-day supply of current diabetes medications is available at the pharmacy.   HTN Blood pressure is well-controlled.   Isabella Joyce is taking hydrochlorothiazide  12.5 mg lisinopril  40 mg and Cardizem  180 mg daily as prescribed. Continue all antihypertensives as prescribed.  Reminded to bring in blood pressure log for follow  up appointment.  RECOMMENDATIONS: DASH/Mediterranean Diets are healthier choices for HTN.      Patient has been counseled on age-appropriate routine health concerns for screening and prevention. These are reviewed and up-to-date. Referrals have been placed accordingly. Immunizations are up-to-date or declined.    Subjective:   Chief Complaint  Patient presents with   Diabetes   History of Present Illness Isabella Joyce is a 53 year old female with diabetes who presents for follow-up of Isabella Joyce A1c levels.  VRI was used to communicate directly with patient for the entire encounter including providing detailed patient instructions.    Isabella Joyce has a past medical history of COVID-19 virus infection (12/12/2018), Diabetes mellitus without complication  (2009), Partially deaf, Hyperlipidemia, and Hypertension (2008).   DM 2 Isabella Joyce A1c level has increased from 7.0 to 7.5 since Isabella Joyce last visit, indicating a slight worsening of glycemic control. Isabella Joyce is currently managing Isabella Joyce diabetes with glimepiride  and metformin  and has no new symptoms or concerns at this time.  Isabella Joyce declines any GLP-1 today and states Isabella Joyce would like to work on dietary and exercise modifications and if A1c continues elevated at next visit Isabella Joyce is amenable to starting  GLP-1  HTN BP at goal. Isabella Joyce is not experiencing any cardiac symptoms BP Readings from Last 3 Encounters:  12/20/23 114/75  09/20/23 121/78  06/21/23 119/77     Isabella Joyce has significant hearing difficulties, which necessitate the use of an interpreter for effective communication. Despite these challenges, Isabella Joyce has not reported any issues with Isabella Joyce current diabetes management regimen.  We briefly discussed Isabella Joyce mammogram results from March which were normal, and Isabella Joyce has not experienced any changes in Isabella Joyce breast health.  Review of Systems  Constitutional:  Negative for fever, malaise/fatigue and weight loss.  HENT:  Positive for hearing loss. Negative for nosebleeds.   Eyes: Negative.  Negative for blurred vision, double vision and photophobia.  Respiratory: Negative.  Negative for cough and shortness of breath.   Cardiovascular: Negative.  Negative for chest pain, palpitations and leg swelling.  Gastrointestinal: Negative.  Negative for heartburn, nausea and vomiting.  Musculoskeletal: Negative.  Negative for myalgias.  Neurological: Negative.  Negative for dizziness, focal weakness, seizures and headaches.  Psychiatric/Behavioral: Negative.  Negative for suicidal ideas.     Past Medical History:  Diagnosis Date   COVID-19 virus infection 12/12/2018   Diabetes mellitus without complication (HCC) 2009   Hearing deficit    Hyperlipidemia    Hypertension 2008    No past surgical history on file.  Family History  Problem Relation Age of Onset   Diabetes Mother    Hypertension Mother    Diabetes Father    Hypertension Father    Cancer Neg Hx    Heart disease Neg Hx     Social History Reviewed with no changes to be made today.   Outpatient Medications Prior  to Visit  Medication Sig Dispense Refill   Blood Glucose Monitoring Suppl (TRUE METRIX METER) w/Device KIT USE AS DIRECTED 2 TIMES DAILY 1 kit 0   diltiazem  (CARDIZEM  CD) 180 MG 24 hr capsule Take 1 capsule (180 mg total) by  mouth daily. For blood pressure. Please fill as a 90 day supply 90 capsule 1   glucose blood (TRUE METRIX BLOOD GLUCOSE TEST) test strip Use to check blood sugar once daily. (Must have office visit for refills) 100 each 6   TRUEplus Lancets 28G MISC USE AS DIRECTED ONCE DAILY 100 each 6   atorvastatin  (LIPITOR) 40 MG tablet Take 1 tablet (40 mg total) by mouth daily at 6 PM. For cholesterol 90 tablet 3   glimepiride  (AMARYL ) 4 MG tablet Take 1 tablet (4 mg total) by mouth daily before breakfast. For diabetes. 90 tablet 1   hydrochlorothiazide  (MICROZIDE ) 12.5 MG capsule Take 1 capsule (12.5 mg total) by mouth daily. For hypertension. Please fill as a 90 day supply 90 capsule 3   lisinopril  (ZESTRIL ) 40 MG tablet Take 1 tablet (40 mg total) by mouth daily. For blood pressure. Please fill as a 90 day supply 90 tablet 1   loratadine  (CLARITIN ) 10 MG tablet Take 1 tablet (10 mg total) by mouth daily. For allergies 90 tablet 3   metFORMIN  (GLUCOPHAGE -XR) 500 MG 24 hr tablet Take 2 tablets (1,000 mg total) by mouth 2 (two) times daily with a meal. FOR DIABETES. Please fill as a 90 day supply 360 tablet 1   No facility-administered medications prior to visit.    Allergies  Allergen Reactions   Pollen Extract     Hear swelling       Objective:    BP 114/75 (BP Location: Left Arm, Patient Position: Sitting, Cuff Size: Normal)   Pulse 74   Resp 19   Ht 5' 5 (1.651 m)   Wt 182 lb (82.6 kg)   SpO2 100%   BMI 30.29 kg/m  Wt Readings from Last 3 Encounters:  12/20/23 182 lb (82.6 kg)  09/20/23 186 lb 6.4 oz (84.6 kg)  06/21/23 187 lb 12.8 oz (85.2 kg)    Physical Exam Vitals and nursing note reviewed.  Constitutional:      Appearance: Isabella Joyce is well-developed.  HENT:     Head: Normocephalic and atraumatic.  Cardiovascular:     Rate and Rhythm: Normal rate and regular rhythm.     Heart sounds: Normal heart sounds. No murmur heard.    No friction rub. No gallop.  Pulmonary:     Effort:  Pulmonary effort is normal. No tachypnea or respiratory distress.     Breath sounds: Normal breath sounds. No decreased breath sounds, wheezing, rhonchi or rales.  Chest:     Chest wall: No tenderness.  Musculoskeletal:        General: Normal range of motion.     Cervical back: Normal range of motion.  Skin:    General: Skin is warm and dry.  Neurological:     Mental Status: Isabella Joyce is alert and oriented to person, place, and time.     Coordination: Coordination normal.  Psychiatric:        Behavior: Behavior normal. Behavior is cooperative.        Thought Content: Thought content normal.        Judgment: Judgment normal.          Patient has been counseled extensively about nutrition and exercise as well as the importance of adherence  with medications and regular follow-up. The patient was given clear instructions to go to ER or return to medical center if symptoms don't improve, worsen or new problems develop. The patient verbalized understanding.   Follow-up: Return in about 14 weeks (around 03/27/2024).   Haze LELON Servant, FNP-BC [facility] and [facility]ness [place], [place] [phone]   12/20/2023, 4:40 PM

## 2023-12-21 ENCOUNTER — Ambulatory Visit: Payer: Self-pay | Admitting: Nurse Practitioner

## 2023-12-22 LAB — MICROALBUMIN / CREATININE URINE RATIO
Creatinine, Urine: 124.5 mg/dL
Microalb/Creat Ratio: 7 mg/g{creat} (ref 0–29)
Microalbumin, Urine: 8.2 ug/mL

## 2023-12-22 LAB — BASIC METABOLIC PANEL WITH GFR
BUN/Creatinine Ratio: 16 (ref 9–23)
BUN: 11 mg/dL (ref 6–24)
CO2: 23 mmol/L (ref 20–29)
Calcium: 9.5 mg/dL (ref 8.7–10.2)
Chloride: 103 mmol/L (ref 96–106)
Creatinine, Ser: 0.67 mg/dL (ref 0.57–1.00)
Glucose: 157 mg/dL — ABNORMAL HIGH (ref 70–99)
Potassium: 4.1 mmol/L (ref 3.5–5.2)
Sodium: 141 mmol/L (ref 134–144)
eGFR: 105 mL/min/1.73 (ref >59)

## 2023-12-26 ENCOUNTER — Other Ambulatory Visit: Payer: Self-pay

## 2023-12-29 LAB — BASIC METABOLIC PANEL WITH GFR

## 2023-12-29 LAB — MICROALBUMIN / CREATININE URINE RATIO

## 2023-12-29 LAB — FECAL OCCULT BLOOD, IMMUNOCHEMICAL: Fecal Occult Bld: NEGATIVE

## 2023-12-30 ENCOUNTER — Other Ambulatory Visit: Payer: Self-pay

## 2024-01-11 ENCOUNTER — Other Ambulatory Visit: Payer: Self-pay

## 2024-01-13 ENCOUNTER — Other Ambulatory Visit: Payer: Self-pay

## 2024-02-28 ENCOUNTER — Other Ambulatory Visit: Payer: Self-pay

## 2024-02-28 MED ORDER — FLUZONE 0.5 ML IM SUSY
0.5000 mL | PREFILLED_SYRINGE | Freq: Once | INTRAMUSCULAR | 0 refills | Status: AC
  Filled 2024-02-28: qty 0.5, 1d supply, fill #0

## 2024-03-17 ENCOUNTER — Other Ambulatory Visit: Payer: Self-pay | Admitting: Nurse Practitioner

## 2024-03-17 DIAGNOSIS — I10 Essential (primary) hypertension: Secondary | ICD-10-CM

## 2024-03-18 MED ORDER — DILTIAZEM HCL ER COATED BEADS 180 MG PO CP24
180.0000 mg | ORAL_CAPSULE | Freq: Every day | ORAL | 0 refills | Status: DC
  Filled 2024-03-18 – 2024-03-27 (×2): qty 90, 90d supply, fill #0

## 2024-03-19 ENCOUNTER — Other Ambulatory Visit: Payer: Self-pay

## 2024-03-23 ENCOUNTER — Other Ambulatory Visit: Payer: Self-pay

## 2024-03-27 ENCOUNTER — Other Ambulatory Visit: Payer: Self-pay

## 2024-03-27 ENCOUNTER — Ambulatory Visit: Payer: Self-pay | Attending: Nurse Practitioner | Admitting: Nurse Practitioner

## 2024-03-27 ENCOUNTER — Encounter: Payer: Self-pay | Admitting: Nurse Practitioner

## 2024-03-27 VITALS — BP 118/80 | HR 76 | Resp 18 | Ht 65.0 in | Wt 191.4 lb

## 2024-03-27 DIAGNOSIS — E1165 Type 2 diabetes mellitus with hyperglycemia: Secondary | ICD-10-CM

## 2024-03-27 DIAGNOSIS — Z7984 Long term (current) use of oral hypoglycemic drugs: Secondary | ICD-10-CM

## 2024-03-27 DIAGNOSIS — I10 Essential (primary) hypertension: Secondary | ICD-10-CM

## 2024-03-27 LAB — POCT GLYCOSYLATED HEMOGLOBIN (HGB A1C): Hemoglobin A1C: 7.6 % — AB (ref 4.0–5.6)

## 2024-03-27 MED ORDER — GLIMEPIRIDE 4 MG PO TABS
4.0000 mg | ORAL_TABLET | Freq: Every day | ORAL | 1 refills | Status: DC
  Filled 2024-03-27: qty 90, 90d supply, fill #0

## 2024-03-27 MED ORDER — GLIMEPIRIDE 4 MG PO TABS
4.0000 mg | ORAL_TABLET | Freq: Two times a day (BID) | ORAL | 1 refills | Status: AC
  Filled 2024-03-27 – 2024-06-19 (×2): qty 180, 90d supply, fill #0

## 2024-03-27 MED ORDER — METFORMIN HCL ER 500 MG PO TB24
1000.0000 mg | ORAL_TABLET | Freq: Two times a day (BID) | ORAL | 1 refills | Status: AC
  Filled 2024-03-27: qty 360, 90d supply, fill #0

## 2024-03-27 MED ORDER — LISINOPRIL 40 MG PO TABS
40.0000 mg | ORAL_TABLET | Freq: Every day | ORAL | 1 refills | Status: AC
  Filled 2024-03-27 – 2024-06-19 (×2): qty 90, 90d supply, fill #0

## 2024-03-27 MED ORDER — DILTIAZEM HCL ER COATED BEADS 180 MG PO CP24
180.0000 mg | ORAL_CAPSULE | Freq: Every day | ORAL | 1 refills | Status: AC
  Filled 2024-03-27 – 2024-06-19 (×2): qty 90, 90d supply, fill #0

## 2024-03-27 MED ORDER — DILTIAZEM HCL ER COATED BEADS 180 MG PO CP24: 180.0000 mg | ORAL_CAPSULE | Freq: Every day | ORAL | 0 refills | Status: DC

## 2024-03-27 NOTE — Progress Notes (Signed)
 Assessment & Plan:  Isabella Joyce was seen today for diabetes.  Diagnoses and all orders for this visit:  Type 2 diabetes mellitus with hyperglycemia, without long-term current use of insulin (HCC) -     POCT glycosylated hemoglobin (Hb A1C) -     glimepiride  (AMARYL ) 4 MG tablet; Take 1 tablet (4 mg total) by mouth BID. For diabetes. -     metFORMIN  (GLUCOPHAGE -XR) 500 MG 24 hr tablet; Take 2 tablets (1,000 mg total) by mouth 2 (two) times daily with a meal. FOR DIABETES. Please fill as a 90 day supply A1c at 7.6%, target 6.5%. Current regimen includes metformin  and glimepiride . - Increase glimepiride  to twice daily. - Continue metformin  twice daily. - Monitor blood glucose levels daily. - Report blood glucose readings under 80 mg/dL before January.   Primary hypertension -     lisinopril  (ZESTRIL ) 40 MG tablet; Take 1 tablet (40 mg total) by mouth daily. For blood pressure. Please fill as a 90 day supply -     diltiazem  (CARDIZEM  CD) 180 MG 24 hr capsule; Take 1 capsule (180 mg total) by mouth daily. Please keep upcoming appt for additional refills.    Patient has been counseled on age-appropriate routine health concerns for screening and prevention. These are reviewed and up-to-date. Referrals have been placed accordingly. Immunizations are up-to-date or declined.    Subjective:   Chief Complaint  Patient presents with   Diabetes        History of Present Illness Isabella Joyce is a 53 year old female with diabetes who presents for management of her blood sugar levels.  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     DM 2 She has been experiencing difficulty in lowering her hemoglobin A1c, which is currently at 7.6%, despite aiming for a target of 6.5%. Her blood sugar levels range from 95 to 150 mg/dL in the mornings. Her  current medication regimen includes metformin  taken twice daily and glimepiride  4mg  daily Lab Results  Component Value Date   HGBA1C 7.6 (A) 03/27/2024    HTN Blood pressure is well-controlled with lisinopril  40 mg daily and hydrochlorothiazide  12.5 mg daily BP Readings from Last 3 Encounters:  03/27/24 118/80  12/20/23 114/75  09/20/23 121/78     LDL at goal with atorvastatin  40 mg daily Lab Results  Component Value Date   LDLCALC 50 04/28/2022    ROS  Past Medical History:  Diagnosis Date   COVID-19 virus infection 12/12/2018   Diabetes mellitus without complication (HCC) 2009   Hearing deficit    Hyperlipidemia    Hypertension 2008    No past surgical history on file.  Family History  Problem Relation Age of Onset   Diabetes Mother    Hypertension Mother    Diabetes Father    Hypertension Father    Cancer Neg Hx    Heart disease Neg Hx     Social History Reviewed with no changes to be made today.   Outpatient Medications Prior to Visit  Medication Sig Dispense Refill   atorvastatin  (LIPITOR) 40 MG tablet Take 1 tablet (40 mg total) by mouth daily at 6 PM. For cholesterol 90 tablet 3   Blood Glucose Monitoring Suppl (TRUE METRIX METER) w/Device KIT USE AS DIRECTED 2 TIMES DAILY 1 kit 0   glucose blood (TRUE METRIX BLOOD GLUCOSE TEST) test strip Use to check blood  sugar once daily. (Must have office visit for refills) 100 each 6   hydrochlorothiazide  (MICROZIDE ) 12.5 MG capsule Take 1 capsule (12.5 mg total) by mouth daily. For hypertension. Please fill as a 90 day supply 90 capsule 3   loratadine  (CLARITIN ) 10 MG tablet Take 1 tablet (10 mg total) by mouth daily. For allergies 90 tablet 3   TRUEplus Lancets 28G MISC USE AS DIRECTED ONCE DAILY 100 each 6   diltiazem  (CARDIZEM  CD) 180 MG 24 hr capsule Take 1 capsule (180 mg total) by mouth daily. Please keep upcoming appt for additional refills. 90 capsule 0   glimepiride  (AMARYL ) 4 MG tablet Take 1 tablet (4 mg  total) by mouth daily before breakfast. For diabetes. 90 tablet 1   lisinopril  (ZESTRIL ) 40 MG tablet Take 1 tablet (40 mg total) by mouth daily. For blood pressure. Please fill as a 90 day supply 90 tablet 1   metFORMIN  (GLUCOPHAGE -XR) 500 MG 24 hr tablet Take 2 tablets (1,000 mg total) by mouth 2 (two) times daily with a meal. FOR DIABETES. Please fill as a 90 day supply 360 tablet 1   No facility-administered medications prior to visit.    Allergies  Allergen Reactions   Pollen Extract     Hear swelling       Objective:    BP 118/80 (BP Location: Left Arm, Patient Position: Sitting, Cuff Size: Normal)   Pulse 76   Resp 18   Ht 5' 5 (1.651 m)   Wt 191 lb 6.4 oz (86.8 kg)   LMP  (LMP Unknown)   SpO2 98%   BMI 31.85 kg/m  Wt Readings from Last 3 Encounters:  03/27/24 191 lb 6.4 oz (86.8 kg)  12/20/23 182 lb (82.6 kg)  09/20/23 186 lb 6.4 oz (84.6 kg)    Physical Exam       Patient has been counseled extensively about nutrition and exercise as well as the importance of adherence with medications and regular follow-up. The patient was given clear instructions to go to ER or return to medical center if symptoms don't improve, worsen or new problems develop. The patient verbalized understanding.   Follow-up: No follow-ups on file.   Isabella LELON Servant, FNP-BC [facility] and [facility] [place], [place] [phone]   03/27/2024, 1:49 PM

## 2024-03-29 ENCOUNTER — Ambulatory Visit: Payer: Self-pay | Attending: Nurse Practitioner

## 2024-03-30 NOTE — Progress Notes (Signed)
 Patient in to check wt

## 2024-04-04 ENCOUNTER — Other Ambulatory Visit: Payer: Self-pay

## 2024-06-01 ENCOUNTER — Other Ambulatory Visit: Payer: Self-pay

## 2024-06-19 ENCOUNTER — Other Ambulatory Visit: Payer: Self-pay

## 2024-07-02 ENCOUNTER — Telehealth: Payer: Self-pay | Admitting: Nurse Practitioner

## 2024-07-02 NOTE — Telephone Encounter (Signed)
 Contacted pt confirmed appt

## 2024-07-03 ENCOUNTER — Encounter: Payer: Self-pay | Admitting: Nurse Practitioner

## 2024-07-03 ENCOUNTER — Other Ambulatory Visit: Payer: Self-pay

## 2024-07-03 ENCOUNTER — Ambulatory Visit: Payer: Self-pay | Attending: Nurse Practitioner | Admitting: Nurse Practitioner

## 2024-07-03 VITALS — BP 121/81 | HR 82 | Ht 65.0 in | Wt 177.0 lb

## 2024-07-03 DIAGNOSIS — E119 Type 2 diabetes mellitus without complications: Secondary | ICD-10-CM

## 2024-07-03 DIAGNOSIS — Z7984 Long term (current) use of oral hypoglycemic drugs: Secondary | ICD-10-CM

## 2024-07-03 DIAGNOSIS — Z1231 Encounter for screening mammogram for malignant neoplasm of breast: Secondary | ICD-10-CM

## 2024-07-03 DIAGNOSIS — B001 Herpesviral vesicular dermatitis: Secondary | ICD-10-CM

## 2024-07-03 LAB — POCT GLYCOSYLATED HEMOGLOBIN (HGB A1C): Hemoglobin A1C: 7.6 % — AB (ref 4.0–5.6)

## 2024-07-03 MED ORDER — VALACYCLOVIR HCL 1 G PO TABS
2000.0000 mg | ORAL_TABLET | Freq: Two times a day (BID) | ORAL | 1 refills | Status: AC
  Filled 2024-07-03 (×2): qty 4, 1d supply, fill #0

## 2024-07-03 MED ORDER — EMPAGLIFLOZIN 10 MG PO TABS
10.0000 mg | ORAL_TABLET | Freq: Every day | ORAL | 1 refills | Status: AC
  Filled 2024-07-03: qty 30, 30d supply, fill #0
  Filled 2024-07-03: qty 90, 90d supply, fill #0

## 2024-07-03 NOTE — Progress Notes (Signed)
 "  Assessment & Plan:  Isabella Joyce was seen today for diabetes.  Diagnoses and all orders for this visit:  Diabetes mellitus treated with oral medication  Continue all medications. Add Jardiance  today -     POCT glycosylated hemoglobin (Hb A1C) -     Urine Albumin/Creatinine with ratio (send out) [LAB689] -     empagliflozin  (JARDIANCE ) 10 MG TABS tablet; Take 1 tablet (10 mg total) by mouth daily. FOR DIABETES -     CMP14+EGFR  Herpes labialis -     valACYclovir  (VALTREX ) 1000 MG tablet; Take 2 tablets (2,000 mg total) by mouth 2 (two) times daily. FOR COLD SORE  Breast cancer screening by mammogram -     MS 3D SCR MAMMO BILAT BR (aka MM); Future    Patient has been counseled on age-appropriate routine health concerns for screening and prevention. These are reviewed and up-to-date. Referrals have been placed accordingly. Immunizations are up-to-date or declined.    Subjective:   Chief Complaint  Patient presents with   Diabetes    Isabella Joyce 54 y.o. female presents to office today for follow up to DM  VRI was used to communicate directly with patient for the entire encounter including providing detailed patient instructions.    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   DM 2 No improvement in A1c. Continues at 7.6. She is currently taking metformin  at max dose and glimepiride  4 mg BID.  Lab Results  Component Value Date   HGBA1C 7.6 (A) 07/03/2024    She has several cold sores on her lips today. Will send valtrex  to pharmacy. Review of Systems  Constitutional:  Negative for fever, malaise/fatigue and weight loss.  HENT: Negative.  Negative for nosebleeds.   Respiratory: Negative.  Negative for cough and shortness of breath.   Cardiovascular: Negative.  Negative for chest pain, palpitations and leg swelling.   Skin:  Positive for rash.  Neurological: Negative.  Negative for dizziness, focal weakness, seizures and headaches.  Psychiatric/Behavioral: Negative.  Negative for suicidal ideas.     Past Medical History:  Diagnosis Date   COVID-19 virus infection 12/12/2018   Diabetes mellitus without complication (HCC) 2009   Hearing deficit    Hyperlipidemia    Hypertension 2008    No past surgical history on file.  Family History  Problem Relation Age of Onset   Diabetes Mother    Hypertension Mother    Diabetes Father    Hypertension Father    Cancer Neg Hx    Heart disease Neg Hx     Social History Reviewed with no changes to be made today.   Outpatient Medications Prior to Visit  Medication Sig Dispense Refill   atorvastatin  (LIPITOR) 40 MG tablet Take 1 tablet (40 mg total) by mouth daily at 6 PM. For cholesterol 90 tablet 3   Blood Glucose Monitoring Suppl (TRUE METRIX METER) w/Device KIT USE AS DIRECTED 2 TIMES DAILY 1 kit 0   diltiazem  (CARDIZEM  CD) 180 MG 24 hr capsule Take 1 capsule (180 mg total) by mouth daily. Please keep upcoming appt for additional refills. 90 capsule 1   glimepiride  (AMARYL ) 4 MG tablet Take 1 tablet (4 mg total) by mouth 2 (two) times daily. For diabetes. 180 tablet 1   glucose blood (TRUE METRIX BLOOD GLUCOSE TEST) test strip Use to check blood sugar once daily. (Must have office  visit for refills) 100 each 6   hydrochlorothiazide  (MICROZIDE ) 12.5 MG capsule Take 1 capsule (12.5 mg total) by mouth daily. For hypertension. Please fill as a 90 day supply 90 capsule 3   lisinopril  (ZESTRIL ) 40 MG tablet Take 1 tablet (40 mg total) by mouth daily. For blood pressure. 90 tablet 1   loratadine  (CLARITIN ) 10 MG tablet Take 1 tablet (10 mg total) by mouth daily. For allergies 90 tablet 3   metFORMIN  (GLUCOPHAGE -XR) 500 MG 24 hr tablet Take 2 tablets (1,000 mg total) by mouth 2 (two) times daily with a meal. FOR DIABETES. 360 tablet 1   TRUEplus Lancets 28G MISC  USE AS DIRECTED ONCE DAILY 100 each 6   No facility-administered medications prior to visit.    Allergies[1]     Objective:    BP 121/81 (BP Location: Left Arm, Patient Position: Sitting, Cuff Size: Normal)   Pulse 82   Ht 5' 5 (1.651 m)   Wt 177 lb (80.3 kg)   LMP  (LMP Unknown)   SpO2 98%   BMI 29.45 kg/m  Wt Readings from Last 3 Encounters:  07/03/24 177 lb (80.3 kg)  03/29/24 179 lb 12.8 oz (81.6 kg)  03/27/24 191 lb 6.4 oz (86.8 kg)    Physical Exam Vitals and nursing note reviewed.  Constitutional:      Appearance: She is well-developed.  HENT:     Head: Normocephalic and atraumatic.     Mouth/Throat:     Lips: Lesions present.   Cardiovascular:     Rate and Rhythm: Normal rate and regular rhythm.     Heart sounds: Normal heart sounds. No murmur heard.    No friction rub. No gallop.  Pulmonary:     Effort: Pulmonary effort is normal. No tachypnea or respiratory distress.     Breath sounds: Normal breath sounds. No decreased breath sounds, wheezing, rhonchi or rales.  Chest:     Chest wall: No tenderness.  Musculoskeletal:        General: Normal range of motion.     Cervical back: Normal range of motion.  Skin:    General: Skin is warm and dry.  Neurological:     Mental Status: She is alert and oriented to person, place, and time.     Coordination: Coordination normal.  Psychiatric:        Behavior: Behavior normal. Behavior is cooperative.        Thought Content: Thought content normal.        Judgment: Judgment normal.          Patient has been counseled extensively about nutrition and exercise as well as the importance of adherence with medications and regular follow-up. The patient was given clear instructions to go to ER or return to medical center if symptoms don't improve, worsen or new problems develop. The patient verbalized understanding.   Follow-up: Return in about 3 months (around 10/05/2024).   Isabella LELON Servant, FNP-BC [facility] [place], [place] [phone]   07/10/2024, 10:33 AM     [1]  Allergies Allergen Reactions   Pollen Extract     Hear swelling   "

## 2024-07-05 ENCOUNTER — Ambulatory Visit: Payer: Self-pay | Admitting: Nurse Practitioner

## 2024-07-05 LAB — CMP14+EGFR
ALT: 27 IU/L (ref 0–32)
AST: 17 IU/L (ref 0–40)
Albumin: 4.5 g/dL (ref 3.8–4.9)
Alkaline Phosphatase: 136 IU/L — ABNORMAL HIGH (ref 49–135)
BUN/Creatinine Ratio: 12 (ref 9–23)
BUN: 8 mg/dL (ref 6–24)
Bilirubin Total: 0.7 mg/dL (ref 0.0–1.2)
CO2: 24 mmol/L (ref 20–29)
Calcium: 9.7 mg/dL (ref 8.7–10.2)
Chloride: 102 mmol/L (ref 96–106)
Creatinine, Ser: 0.65 mg/dL (ref 0.57–1.00)
Globulin, Total: 3.3 g/dL (ref 1.5–4.5)
Glucose: 116 mg/dL — ABNORMAL HIGH (ref 70–99)
Potassium: 3.8 mmol/L (ref 3.5–5.2)
Sodium: 142 mmol/L (ref 134–144)
Total Protein: 7.8 g/dL (ref 6.0–8.5)
eGFR: 105 mL/min/1.73

## 2024-07-05 LAB — MICROALBUMIN / CREATININE URINE RATIO
Creatinine, Urine: 21.2 mg/dL
Microalb/Creat Ratio: <14 mg/g{creat} (ref 0–29)
Microalbumin, Urine: <3 ug/mL

## 2024-07-10 ENCOUNTER — Encounter: Payer: Self-pay | Admitting: Nurse Practitioner

## 2024-10-05 ENCOUNTER — Ambulatory Visit: Payer: Self-pay | Admitting: Nurse Practitioner
